# Patient Record
Sex: Female | Born: 1935 | Race: White | Hispanic: No | Marital: Married | Smoking: Never smoker
Health system: Southern US, Community
[De-identification: ages and names within clinical notes are randomized; demographics above are authoritative.]

## PROBLEM LIST (undated history)

## (undated) DIAGNOSIS — N390 Urinary tract infection, site not specified: Secondary | ICD-10-CM

## (undated) DIAGNOSIS — I10 Essential (primary) hypertension: Secondary | ICD-10-CM

## (undated) DIAGNOSIS — K219 Gastro-esophageal reflux disease without esophagitis: Secondary | ICD-10-CM

## (undated) DIAGNOSIS — N952 Postmenopausal atrophic vaginitis: Secondary | ICD-10-CM

## (undated) DIAGNOSIS — J189 Pneumonia, unspecified organism: Secondary | ICD-10-CM

## (undated) DIAGNOSIS — R1310 Dysphagia, unspecified: Secondary | ICD-10-CM

## (undated) DIAGNOSIS — D649 Anemia, unspecified: Secondary | ICD-10-CM

## (undated) DIAGNOSIS — M1990 Unspecified osteoarthritis, unspecified site: Secondary | ICD-10-CM

## (undated) DIAGNOSIS — G4730 Sleep apnea, unspecified: Secondary | ICD-10-CM

## (undated) DIAGNOSIS — R12 Heartburn: Secondary | ICD-10-CM

## (undated) DIAGNOSIS — A419 Sepsis, unspecified organism: Secondary | ICD-10-CM

## (undated) DIAGNOSIS — F0390 Unspecified dementia without behavioral disturbance: Secondary | ICD-10-CM

## (undated) DIAGNOSIS — Z87442 Personal history of urinary calculi: Secondary | ICD-10-CM

## (undated) DIAGNOSIS — I4891 Unspecified atrial fibrillation: Secondary | ICD-10-CM

## (undated) HISTORY — PX: ABDOMINAL HYSTERECTOMY: SUR658

## (undated) HISTORY — DX: Heartburn: R12

## (undated) HISTORY — DX: Sleep apnea, unspecified: G47.30

## (undated) HISTORY — DX: Unspecified osteoarthritis, unspecified site: M19.90

## (undated) HISTORY — PX: ROTATOR CUFF REPAIR: SHX139

## (undated) HISTORY — DX: Unspecified atrial fibrillation: I48.91

## (undated) HISTORY — PX: EYE SURGERY: SHX253

## (undated) HISTORY — DX: Essential (primary) hypertension: I10

## (undated) HISTORY — PX: OTHER SURGICAL HISTORY: SHX169

## (undated) HISTORY — DX: Postmenopausal atrophic vaginitis: N95.2

## (undated) HISTORY — DX: Urinary tract infection, site not specified: N39.0

## (undated) HISTORY — PX: CATARACT EXTRACTION: SUR2

---

## 2000-10-12 HISTORY — PX: ABDOMINAL HYSTERECTOMY: SHX81

## 2004-09-30 ENCOUNTER — Ambulatory Visit: Payer: Self-pay | Admitting: Gastroenterology

## 2005-02-27 ENCOUNTER — Emergency Department: Payer: Self-pay | Admitting: Emergency Medicine

## 2005-02-28 ENCOUNTER — Ambulatory Visit: Payer: Self-pay | Admitting: Emergency Medicine

## 2005-07-05 ENCOUNTER — Ambulatory Visit: Payer: Self-pay | Admitting: Unknown Physician Specialty

## 2006-07-05 ENCOUNTER — Ambulatory Visit: Payer: Self-pay | Admitting: Unknown Physician Specialty

## 2006-08-22 ENCOUNTER — Ambulatory Visit: Payer: Self-pay | Admitting: Unknown Physician Specialty

## 2006-09-07 ENCOUNTER — Ambulatory Visit: Payer: Self-pay | Admitting: Unknown Physician Specialty

## 2006-11-30 ENCOUNTER — Encounter: Payer: Self-pay | Admitting: Specialist

## 2007-06-05 ENCOUNTER — Ambulatory Visit: Payer: Self-pay | Admitting: Specialist

## 2007-07-12 ENCOUNTER — Ambulatory Visit: Payer: Self-pay | Admitting: Gastroenterology

## 2007-08-28 ENCOUNTER — Ambulatory Visit: Payer: Self-pay | Admitting: Unknown Physician Specialty

## 2007-11-07 ENCOUNTER — Emergency Department: Payer: Self-pay | Admitting: Internal Medicine

## 2008-08-29 ENCOUNTER — Ambulatory Visit: Payer: Self-pay | Admitting: Unknown Physician Specialty

## 2009-08-31 ENCOUNTER — Ambulatory Visit: Payer: Self-pay | Admitting: Unknown Physician Specialty

## 2010-09-02 ENCOUNTER — Ambulatory Visit: Payer: Self-pay | Admitting: Unknown Physician Specialty

## 2011-09-16 ENCOUNTER — Ambulatory Visit: Payer: Self-pay | Admitting: Unknown Physician Specialty

## 2012-09-28 ENCOUNTER — Ambulatory Visit: Payer: Self-pay | Admitting: Physician Assistant

## 2013-10-04 ENCOUNTER — Ambulatory Visit: Payer: Self-pay | Admitting: Physician Assistant

## 2014-07-03 ENCOUNTER — Emergency Department: Payer: Self-pay | Admitting: Emergency Medicine

## 2014-10-19 ENCOUNTER — Inpatient Hospital Stay: Payer: Self-pay | Admitting: Internal Medicine

## 2014-10-19 LAB — COMPREHENSIVE METABOLIC PANEL
ALT: 54 U/L
AST: 45 U/L — AB (ref 15–37)
Albumin: 3 g/dL — ABNORMAL LOW (ref 3.4–5.0)
Alkaline Phosphatase: 91 U/L
Anion Gap: 8 (ref 7–16)
BUN: 18 mg/dL (ref 7–18)
Bilirubin,Total: 0.9 mg/dL (ref 0.2–1.0)
CALCIUM: 8.8 mg/dL (ref 8.5–10.1)
CHLORIDE: 98 mmol/L (ref 98–107)
CO2: 28 mmol/L (ref 21–32)
CREATININE: 0.86 mg/dL (ref 0.60–1.30)
EGFR (African American): >60
GLUCOSE: 114 mg/dL — AB (ref 65–99)
OSMOLALITY: 271 (ref 275–301)
POTASSIUM: 3.9 mmol/L (ref 3.5–5.1)
Sodium: 134 mmol/L — ABNORMAL LOW (ref 136–145)
Total Protein: 6.9 g/dL (ref 6.4–8.2)

## 2014-10-19 LAB — URINALYSIS, COMPLETE
BILIRUBIN, UR: NEGATIVE
Glucose,UR: NEGATIVE mg/dL (ref 0–75)
NITRITE: NEGATIVE
PH: 6 (ref 4.5–8.0)
Protein: NEGATIVE
SPECIFIC GRAVITY: 1.017 (ref 1.003–1.030)
SQUAMOUS EPITHELIAL: NONE SEEN

## 2014-10-19 LAB — PROTIME-INR
INR: 1.1
PROTHROMBIN TIME: 14 s (ref 11.5–14.7)

## 2014-10-19 LAB — CBC
HCT: 37.5 % (ref 35.0–47.0)
HGB: 12.3 g/dL (ref 12.0–16.0)
MCH: 28.3 pg (ref 26.0–34.0)
MCHC: 32.8 g/dL (ref 32.0–36.0)
MCV: 86 fL (ref 80–100)
Platelet: 190 10*3/uL (ref 150–440)
RBC: 4.35 10*6/uL (ref 3.80–5.20)
RDW: 14.4 % (ref 11.5–14.5)
WBC: 11.4 10*3/uL — ABNORMAL HIGH (ref 3.6–11.0)

## 2014-10-19 LAB — TROPONIN I

## 2014-10-20 LAB — CBC WITH DIFFERENTIAL/PLATELET
BASOS PCT: 0.2 %
Basophil #: 0 10*3/uL (ref 0.0–0.1)
EOS ABS: 0 10*3/uL (ref 0.0–0.7)
Eosinophil %: 0 %
HCT: 35.1 % (ref 35.0–47.0)
HGB: 11.3 g/dL — ABNORMAL LOW (ref 12.0–16.0)
Lymphocyte #: 0.8 10*3/uL — ABNORMAL LOW (ref 1.0–3.6)
Lymphocyte %: 7 %
MCH: 28 pg (ref 26.0–34.0)
MCHC: 32.2 g/dL (ref 32.0–36.0)
MCV: 87 fL (ref 80–100)
MONOS PCT: 14.2 %
Monocyte #: 1.7 x10 3/mm — ABNORMAL HIGH (ref 0.2–0.9)
NEUTROS ABS: 9.4 10*3/uL — AB (ref 1.4–6.5)
NEUTROS PCT: 78.6 %
PLATELETS: 165 10*3/uL (ref 150–440)
RBC: 4.03 10*6/uL (ref 3.80–5.20)
RDW: 14 % (ref 11.5–14.5)
WBC: 12 10*3/uL — AB (ref 3.6–11.0)

## 2014-10-20 LAB — BASIC METABOLIC PANEL
ANION GAP: 9 (ref 7–16)
BUN: 18 mg/dL (ref 7–18)
CALCIUM: 8.3 mg/dL — AB (ref 8.5–10.1)
Chloride: 103 mmol/L (ref 98–107)
Co2: 27 mmol/L (ref 21–32)
Creatinine: 0.77 mg/dL (ref 0.60–1.30)
EGFR (African American): >60
EGFR (Non-African Amer.): >60
Glucose: 86 mg/dL (ref 65–99)
Osmolality: 279 (ref 275–301)
Potassium: 3.7 mmol/L (ref 3.5–5.1)
Sodium: 139 mmol/L (ref 136–145)

## 2014-10-22 LAB — CBC WITH DIFFERENTIAL/PLATELET
Basophil #: 0 10*3/uL (ref 0.0–0.1)
Basophil %: 0.7 %
EOS PCT: 2.1 %
Eosinophil #: 0.1 10*3/uL (ref 0.0–0.7)
HCT: 30.1 % — ABNORMAL LOW (ref 35.0–47.0)
HGB: 9.8 g/dL — ABNORMAL LOW (ref 12.0–16.0)
LYMPHS PCT: 21.6 %
Lymphocyte #: 1.1 10*3/uL (ref 1.0–3.6)
MCH: 28.5 pg (ref 26.0–34.0)
MCHC: 32.5 g/dL (ref 32.0–36.0)
MCV: 88 fL (ref 80–100)
Monocyte #: 0.5 x10 3/mm (ref 0.2–0.9)
Monocyte %: 9.2 %
NEUTROS ABS: 3.3 10*3/uL (ref 1.4–6.5)
NEUTROS PCT: 66.4 %
Platelet: 187 10*3/uL (ref 150–440)
RBC: 3.43 10*6/uL — ABNORMAL LOW (ref 3.80–5.20)
RDW: 14.2 % (ref 11.5–14.5)
WBC: 5 10*3/uL (ref 3.6–11.0)

## 2014-10-22 LAB — URINE CULTURE

## 2014-10-24 LAB — CULTURE, BLOOD (SINGLE)

## 2015-04-04 NOTE — H&P (Signed)
PATIENT NAME:  Denise Matthews, Denise Matthews MR#:  [id number] DATE OF BIRTH:  May 28, 1920  DATE OF ADMISSION:  10/19/2014  REFERRING PHYSICIAN:  Larae Grooms, MD  PRIMARY CARE PHYSICIAN:  Hewitt Blade. Sarina Ser, MD  CHIEF COMPLAINT:  Fever.   HISTORY OF PRESENT ILLNESS:  This is a 81 year old Caucasian female with history of hypertension, who is presenting with weakness and fever. The patient herself is unable to provide any meaningful information given mental status. History is obtained from daughter who is at the bedside. Apparently, she had a fever documented at home of 101 degrees Fahrenheit of one day's duration with associated altered mental status, described mainly as lethargy as well as confusion. Once again, the patient is unable to provide any further meaningful information given current mental status. They called her PCP, who advised to present to the hospital for further workup and evaluation. Here, she remained febrile at 101.7 degrees Fahrenheit. The remainder of her workup was revealing for a UTI.    REVIEW OF SYSTEMS:  Unable to obtain given the patient's mental status.   PAST MEDICAL HISTORY:  Hypertension, gastroesophageal reflux disease, obstructive sleep apnea, noncompliant with CPAP therapy.   SOCIAL HISTORY:  No alcohol, tobacco, or drug usage. Uses a walker for ambulation. Her daughter recently took her out of assisted living  FAMILY HISTORY:  No cardiovascular or pulmonary disorders.   ALLERGIES:  MACROBID, VITAMIN D2.   HOME MEDICATIONS:  Acetaminophen/tramadol 325/37.5 mg p.o. 3 times a day as needed for pain, metoprolol succinate 25 mg p.o. daily, donepezil 5 mg p.o. at bedtime, calcium plus vitamin D 600/400 international units 1 tab p.o. b.i.d., PreserVision multivitamin 1 tablet daily, vitamin B12, 1000 mcg p.o. daily.   PHYSICAL EXAMINATION: VITAL SIGNS:  Temperature 101.7, heart rate 94, respirations 20, blood pressure 137/59, . Weight 58.2 kg, BMI 22.8.  GENERAL:  Chronically  ill frail-appearing Caucasian female, currently in minimal distress given mental status.  HEAD:  Normocephalic, atraumatic.  EYES:  Pupils are equal, round, and reactive to light. Extraocular muscles are intact. No scleral icterus.  MOUTH:  Markedly dry mucosal membranes. Dentition is poor. No abscess noted.  EARS, NOSE, AND THROAT:  Clear without exudate. No external lesions. NECK:  Supple. No thyromegaly. No nodules. No JVD.  PULMONARY:  Clear to auscultation bilaterally without wheezes, rubs, or rhonchi. No use of accessory muscles. Poor respiratory effort.  CHEST:  Nontender to palpation.  CARDIOVASCULAR:  S1, S2, regular rate and rhythm. No murmurs, rubs, or gallops. No edema. Pedal pulses are 2+ bilaterally.  GASTROINTESTINAL:  Soft, nontender, nondistended. No masses. Positive bowel sounds. No hepatosplenomegaly.  MUSCULOSKELETAL:  No swelling, clubbing, or edema. Range of motion is full in all extremities.  NEUROLOGIC:  Cranial nerves II through XII are intact, however, somewhat difficult to get an accurate examination as she is unable to follow commands.  SKIN:  No ulceration, lesions, rashes, or cyanosis. Skin is warm and dry. Turgor is poor.  PSYCHIATRIC:  Mood and affect are blunted. She is awake, alert, and oriented only to person. Insight and judgment are poor at this time.   LABORATORY DATA AND IMAGING:  CT of the head performed reveals no acute intracranial process. Chest x-ray performed reveals no acute cardiopulmonary process. Remainder of laboratory data:  Sodium 134, potassium 3.9, chloride 98, bicarbonate 28, BUN 18, creatinine 0.86, glucose 114. LFTs:  Albumin 3, otherwise within normal limits. WBC 11.4, hemoglobin 12.3, platelets of 190,000. Urinalysis:  WBCs 46, RBCs 2, leukocyte esterase trace, epithelial  cells none.   ASSESSMENT AND PLAN:  A 81 year old Caucasian female with history of hypertension, presenting with weakness and fever, unable to provide any meaningful  information given mental status, which is somewhat worse than baseline.   1.  Sepsis secondary to urinary tract infection, meeting sepsis criteria by heart rate, temperature, respiratory rate, and leukocytosis present on arrival. Panculture including blood and urine, IV fluid hydration, mean arterial pressure greater than 65, antibiotic coverage with ceftriaxone, follow up culture data, adjust antibiotics accordingly.  2.  Hyponatremia. IV fluid hydration. Will follow sodium levels.  3.  Hypertension. Continue with beta blockade.  4.  Venous thromboembolism prophylaxis with heparin subcutaneously.  CODE STATUS:  The patient is a full code.   TIME SPENT:  45 minutes.    ____________________________ Aaron Mose. Hower, MD dkh:nb D: 10/19/2014 22:18:00 ET T: 10/19/2014 22:30:17 ET JOB#: [id number]  cc: Aaron Mose. Hower, MD, <Dictator> DAVID Woodfin Ganja MD ELECTRONICALLY SIGNED 10/20/2014 0:24

## 2015-04-04 NOTE — Discharge Summary (Signed)
PATIENT NAME:  Denise Matthews, Denise Matthews MR#:  [id number] DATE OF BIRTH:  09-20-1920  DATE OF ADMISSION:  10/19/2014 DATE OF DISCHARGE:  10/22/2014  CHIEF COMPLAINT: Fever.   ADMITTING DIAGNOSES:  1.  Sepsis secondary to urinary tract infection.  2.  Hyponatremia.  3.  Hypertension.   DISCHARGE DIAGNOSES:  1.  Sepsis from urinary tract infection.  2.  Hypernatremia, resolved.   PROCEDURES: None.   CONSULTATIONS: None.   BRIEF HISTORY AND HOSPITAL COURSE: The patient is a 81 year old Caucasian female brought into the ED after she spiked a temperature of 101 degrees Fahrenheit. She was brought in to the ED by her daughter.  The patient was altered. The patient was admitted to the hospital with sepsis from urinary tract infection. She was started on IV fluids and IV Rocephin was started after pancultures were obtained.   HOSPITAL COURSE:  1.  Blood cultures turned out to be negative x 2. Urine culture was not done from the ED.  As the laboratory did not receive any urine sample urine culture was ordered on November 10, which has revealed no growth. The patient is clinically doing fine. Decision is to discharge the patient with ciprofloxacin.  2.  Hyponatremia is significantly improved with IV fluids.  3.  The patient has chronic dementia of Alzheimer disease and the plan is to continue Aricept.  According to the patient's daughter she is at her baseline.  4.  Deconditioning, was provided with physical therapy. We have recommended to place the patient to SNF. After talking to her daughter who is agreeable the patient is transferred to [facility] facility for further continuation of the care. Condition was stable at the time of transfer.   LABORATORIES AND IMAGING STUDIES:  CAT scan of the head, no acute intracranial abnormalities. Portable chest x-ray, no acute abnormalities. On November 11 white count is normal, hemoglobin 9.8, hematocrit 30.1, platelets are normal. Urine culture  has revealed no growth 18-24 hours. Blood culture has revealed no growth in 48 hours x 2. BMP on November 9 was normal. Troponin less than 0.02.   MEDICATIONS AT THE TIME OF DISCHARGE: Metoprolol 50 mg 1/2 tablet p.o. once daily, vitamin B12 1000 mcg 1 tablet p.o. once daily, calcium carbonate 600 mg 1 tablet p.o. 2 times a day, Aricept 5 mg p.o. once daily, multivitamins with minerals 1 capsule p.o. 2 times a day, Tylenol with tramadol 325/37.5 one tablet p.o. 3 times a day, ciprofloxacin 250 mg 1 tablet p.o. q. 12 hours for 3 more days.    DIET:  Low sodium.    ACTIVITY: As recommended by physical therapy.   FOLLOWUP:  With primary care physician in 1-2 weeks.   Plan of care was discussed in detail with the patient's daughter, she verbalized understanding of the plan.   TOTAL TIME SPENT: 45 minutes.   Plan of care was discussed in detail with the patient, she verbalized understanding of the plan.    ____________________________ Nicholes Mango, MD ag:bu D: 10/22/2014 16:36:50 ET T: 10/22/2014 17:25:21 ET JOB#: [id number]  cc: Nicholes Mango, MD, <Dictator> Nicholes Mango MD ELECTRONICALLY SIGNED 10/29/2014 22:09

## 2015-12-24 DIAGNOSIS — R410 Disorientation, unspecified: Secondary | ICD-10-CM | POA: Diagnosis not present

## 2016-01-06 DIAGNOSIS — M6281 Muscle weakness (generalized): Secondary | ICD-10-CM | POA: Diagnosis not present

## 2016-01-06 DIAGNOSIS — W19XXXA Unspecified fall, initial encounter: Secondary | ICD-10-CM | POA: Diagnosis not present

## 2016-01-08 DIAGNOSIS — Z111 Encounter for screening for respiratory tuberculosis: Secondary | ICD-10-CM | POA: Diagnosis not present

## 2016-02-19 DIAGNOSIS — R410 Disorientation, unspecified: Secondary | ICD-10-CM | POA: Diagnosis not present

## 2016-02-19 DIAGNOSIS — R350 Frequency of micturition: Secondary | ICD-10-CM | POA: Diagnosis not present

## 2016-02-22 DIAGNOSIS — M810 Age-related osteoporosis without current pathological fracture: Secondary | ICD-10-CM | POA: Diagnosis not present

## 2016-02-22 DIAGNOSIS — E782 Mixed hyperlipidemia: Secondary | ICD-10-CM | POA: Diagnosis not present

## 2016-02-22 DIAGNOSIS — I480 Paroxysmal atrial fibrillation: Secondary | ICD-10-CM | POA: Diagnosis not present

## 2016-02-22 DIAGNOSIS — I10 Essential (primary) hypertension: Secondary | ICD-10-CM | POA: Diagnosis not present

## 2016-02-22 DIAGNOSIS — F0391 Unspecified dementia with behavioral disturbance: Secondary | ICD-10-CM | POA: Diagnosis not present

## 2016-03-07 DIAGNOSIS — G8929 Other chronic pain: Secondary | ICD-10-CM | POA: Diagnosis not present

## 2016-03-07 DIAGNOSIS — N390 Urinary tract infection, site not specified: Secondary | ICD-10-CM | POA: Diagnosis not present

## 2016-03-07 DIAGNOSIS — F0391 Unspecified dementia with behavioral disturbance: Secondary | ICD-10-CM | POA: Diagnosis not present

## 2016-03-07 DIAGNOSIS — R4182 Altered mental status, unspecified: Secondary | ICD-10-CM | POA: Diagnosis not present

## 2016-03-07 DIAGNOSIS — M25561 Pain in right knee: Secondary | ICD-10-CM | POA: Diagnosis not present

## 2016-03-07 DIAGNOSIS — R5383 Other fatigue: Secondary | ICD-10-CM | POA: Diagnosis not present

## 2016-03-10 DIAGNOSIS — M1711 Unilateral primary osteoarthritis, right knee: Secondary | ICD-10-CM | POA: Diagnosis not present

## 2016-03-15 DIAGNOSIS — R8271 Bacteriuria: Secondary | ICD-10-CM | POA: Diagnosis not present

## 2016-03-29 ENCOUNTER — Encounter: Payer: Self-pay | Admitting: Urology

## 2016-03-29 ENCOUNTER — Ambulatory Visit (INDEPENDENT_AMBULATORY_CARE_PROVIDER_SITE_OTHER): Payer: PPO | Admitting: Urology

## 2016-03-29 VITALS — BP 157/90 | HR 98 | Ht 62.0 in | Wt 118.7 lb

## 2016-03-29 DIAGNOSIS — N952 Postmenopausal atrophic vaginitis: Secondary | ICD-10-CM

## 2016-03-29 DIAGNOSIS — N390 Urinary tract infection, site not specified: Secondary | ICD-10-CM

## 2016-03-29 LAB — URINALYSIS, COMPLETE
BILIRUBIN UA: NEGATIVE
GLUCOSE, UA: NEGATIVE
KETONES UA: NEGATIVE
NITRITE UA: NEGATIVE
SPEC GRAV UA: 1.02 (ref 1.005–1.030)
UUROB: 0.2 mg/dL (ref 0.2–1.0)
pH, UA: 5.5 (ref 5.0–7.5)

## 2016-03-29 LAB — MICROSCOPIC EXAMINATION
Bacteria, UA: NONE SEEN
EPITHELIAL CELLS (NON RENAL): NONE SEEN /HPF (ref 0–10)
RBC MICROSCOPIC, UA: NONE SEEN /HPF (ref 0–?)

## 2016-03-29 LAB — BLADDER SCAN AMB NON-IMAGING: SCAN RESULT: 0

## 2016-03-29 NOTE — Progress Notes (Signed)
03/29/2016 3:15 PM   Denise Matthews 07/22/1920 [id number]  Referring provider: No referring provider defined for this encounter.  Chief Complaint  Patient presents with  . Recurrent UTI    referred by Denise Reaper PA    HPI: Patient is a 82 year old Caucasian female who is referred by her PCP, Denise Reaper PA, or recurrent urinary tract infections.  Patient is a poor historian and presents today with her daughter, Denise Matthews.  Patient's daughter states that she's had 3 urinary tract infections since Christmas.   Her urine cultures have been positive for enterococcus and Escherichia coli.  Her symptoms with a urinary tract infection consist of mental confusion.  Patient's daughter states since 2001, after a complete hysterectomy and vaginal sling, she is had incontinence. Her daughter describes it as an urge incontinence. She states that the patient wears 1 depends during the day and changes it at night before she goes to bed.  The incontinence volume is mild to moderate.  Her baseline urinary symptoms consist of urgency, nocturia 3, urge incontinence, intermittency and hesitancy.  She does not complain of dysuria, gross hematuria or suprapubic pain.  She does not have a prior history of kidney stone disease.  She is not experiencing fever, chills, nausea or vomiting.  Her daughter states that she has not noted any recent mental confusion.  Her UA is unremarkable and her PVR is 0 mL.  She had a CT of the abdomen and pelvis in 2011 and it noted a peripelvic cyst involving the left kidney and a right renal cyst. No urinary calculi were identified.    PMH: Past Medical History  Diagnosis Date  . Heartburn   . Arthritis   . Recurrent UTI   . Atrial fibrillation ([place])   . HTN (hypertension)   . Sleep apnea     Surgical History: Past Surgical History  Procedure Laterality Date  . Abdominal hysterectomy    . Cataract extraction    . Rotator cuff repair    . Vaginal sling       Home Medications:    Medication List       This list is accurate as of: 03/29/16  3:15 PM.  Always use your most recent med list.               acetaminophen 500 MG tablet  Commonly known as:  TYLENOL  Take by mouth.     amoxicillin-clavulanate 500-125 MG tablet  Commonly known as:  AUGMENTIN  Reported on 03/29/2016     CALCIUM 500/D 500-200 MG-UNIT tablet  Generic drug:  calcium-vitamin D  Take by mouth.     cefUROXime 250 MG tablet  Commonly known as:  CEFTIN  Reported on 03/29/2016     ciprofloxacin 250 MG tablet  Commonly known as:  CIPRO  Reported on 03/29/2016     docusate sodium 100 MG capsule  Commonly known as:  COLACE  Take by mouth.     donepezil 5 MG tablet  Commonly known as:  ARICEPT  Take by mouth.     metoprolol succinate 50 MG 24 hr tablet  Commonly known as:  TOPROL-XL  Take 25 mg by mouth.     PRESERVISION AREDS PO  Take by mouth 2 (two) times daily.     QUEtiapine 25 MG tablet  Commonly known as:  SEROQUEL  Take by mouth.     RA VITAMIN B-12 TR 1000 MCG Tbcr  Generic drug:  Cyanocobalamin  Take by mouth.  traMADol-acetaminophen 37.5-325 MG tablet  Commonly known as:  ULTRACET  Take by mouth.        Allergies:  Allergies  Allergen Reactions  . Cholecalciferol Other (See Comments)  . Nitrofurantoin Macrocrystal Itching and Rash    Family History: Family History  Problem Relation Age of Onset  . Kidney disease Neg Hx   . Bladder Cancer Neg Hx     Social History:  reports that she has never smoked. She does not have any smokeless tobacco history on file. She reports that she does not drink alcohol or use illicit drugs.  ROS: UROLOGY Frequent Urination?: No Hard to postpone urination?: Yes Burning/pain with urination?: No Get up at night to urinate?: Yes Leakage of urine?: Yes Urine stream starts and stops?: Yes Trouble starting stream?: Yes Do you have to strain to urinate?: No Blood in urine?: No Urinary  tract infection?: Yes Sexually transmitted disease?: No Injury to kidneys or bladder?: No Painful intercourse?: No Weak stream?: No Currently pregnant?: No Vaginal bleeding?: No Last menstrual period?: n  Gastrointestinal Nausea?: No Vomiting?: No Indigestion/heartburn?: No Diarrhea?: No Constipation?: Yes  Constitutional Fever: No Night sweats?: No Weight loss?: Yes Fatigue?: No  Skin Skin rash/lesions?: No Itching?: No  Eyes Blurred vision?: No Double vision?: No  Ears/Nose/Throat Sore throat?: No Sinus problems?: No  Hematologic/Lymphatic Swollen glands?: No Easy bruising?: Yes  Cardiovascular Leg swelling?: Yes Chest pain?: No  Respiratory Cough?: No Shortness of breath?: No  Endocrine Excessive thirst?: No  Musculoskeletal Back pain?: No Joint pain?: No  Neurological Headaches?: No Dizziness?: No  Psychologic Depression?: No Anxiety?: No  Physical Exam: BP 157/90 mmHg  Pulse 98  Ht 5\' 2"  (1.575 m)  Wt 118 lb 11.2 oz (53.842 kg)  BMI 21.71 kg/m2  Constitutional: Well nourished. Alert and oriented, No acute distress. HEENT: [place] AT, moist mucus membranes. Trachea midline, no masses. Cardiovascular: No clubbing, cyanosis, or edema. Respiratory: Normal respiratory effort, no increased work of breathing. GI: Abdomen is soft, non tender, non distended, no abdominal masses. Liver and spleen not palpable.  No hernias appreciated.  Stool sample for occult testing is not indicated.   GU: No CVA tenderness.  No bladder fullness or masses.  Atrophic external genitalia, normal pubic hair distribution, external labia with multiple sebaceous cysts.   Urethral caruncle is noted without prolapse or discharge.   No urethral masses, tenderness and/or tenderness. No bladder fullness, tenderness or masses. Atrophic vagina mucosa, poor estrogen effect, no discharge, no lesions, good pelvic support, no cystocele or rectocele noted.  Cervix, uterus and ovaries are  surgically absent Anus and perineum are without rashes or lesions.    Skin: No rashes, bruises or suspicious lesions. Lymph: No cervical or inguinal adenopathy. Neurologic: Grossly intact, no focal deficits, moving all 4 extremities. Psychiatric: Normal mood and affect.  Laboratory Data: Lab Results  Component Value Date   WBC 5.0 10/22/2014   HGB 9.8* 10/22/2014   HCT 30.1* 10/22/2014   MCV 88 10/22/2014   PLT 187 10/22/2014    Lab Results  Component Value Date   CREATININE 0.77 10/20/2014   Lab Results  Component Value Date   AST 45* 10/19/2014   Lab Results  Component Value Date   ALT 54 10/19/2014    Urinalysis Results for orders placed or performed in visit on 03/29/16  Microscopic Examination  Result Value Ref Range   WBC, UA >30 (A) 0 -  5 /hpf   RBC, UA None seen 0 -  2 /  hpf   Epithelial Cells (non renal) None seen 0 - 10 /hpf   Bacteria, UA None seen None seen/Few  Urinalysis, Complete  Result Value Ref Range   Specific Gravity, UA 1.020 1.005 - 1.030   pH, UA 5.5 5.0 - 7.5   Color, UA Yellow Yellow   Appearance Ur Turbid (A) Clear   Leukocytes, UA 3+ (A) Negative   Protein, UA 3+ (A) Negative/Trace   Glucose, UA Negative Negative   Ketones, UA Negative Negative   RBC, UA 3+ (A) Negative   Bilirubin, UA Negative Negative   Urobilinogen, Ur 0.2 0.2 - 1.0 mg/dL   Nitrite, UA Negative Negative   Microscopic Examination See below:   BLADDER SCAN AMB NON-IMAGING  Result Value Ref Range   Scan Result 0     Pertinent Imaging:  PRIOR REPORT IMPORTED FROM THE [employer] WORKFLOW SYSTEM   REASON FOR EXAM: abd pain with weight loss hx pelvic surgery using mesh  COMMENTS:   PROCEDURE: KCT - KCT ABDOMEN/PELVIS WO - Sep 02 2010 2:19PM   RESULT: Axial CT scanning was performed through the abdomen and pelvis  at 5 mm intervals and slice thicknesses following administration of oral  contrast. Review of multiplanar reconstructed images was  performed  separately on the VIA monitor.   There is a large hiatal hernia/partially intrathoracic stomach. The liver  exhibits no focal mass or ductal dilation. This gallbladder is adequately  distended with no evidence of calcified stones. The pancreas is grossly  normal for this noncontrast exam. The spleen is not enlarged. I see no  adrenal masses. There are parapelvic cyst involving the left kidney. There  is a cortically based smoothly marginated 2.5 x 3 cm diameter hypodensity  associated with the upper pole of the right kidney compatible with a cyst.  It exhibits Hounsfield measurement of +1. The caliber of the abdominal  aorta  is normal. I see no periaortic nor pericaval if adenopathy.   The partially contrast-filled loops of small bowel are normal in  appearance.  Contrast has not yet reached the colon. The stool and gas pattern is  grossly  normal. The urinary bladder is normal in appearance. The uterus is  surgically absent. I see no adnexal masses nor free pelvic fluid. The lung  bases exhibit mild emphysematous change. There is degenerative change and  scoliosis of the lumbar spine with the convexity toward the right.   IMPRESSION:  1. The study is limited without IV contrast material. The orally  administered contrast has not yet reached the colon. I do not see evidence  of acute bowel abnormality.  2. I do not see evidence of acute hepatobiliary abnormality nor acute  abnormality of the kidneys.  3. I do not see intra-abdominal nor pelvic lymphadenopathy.  4. There is a large hiatal hernia-partially intrathoracic stomach. There  is  considerable thickening of portions of the wall of this hernia-stomach. It  may be useful to consider the patient for direct visualization to exclude  mucosal abnormalities here.     Assessment & Plan:    1. Recurrent UTI:   Patient not exhibiting signs of infection at this time. We will  continue to monitor. She is initiated on vaginal estrogen cream in order to improve the vaginal mucosa. She will be returning in 2 weeks and we'll recheck a UA at that time.  - Urinalysis, Complete - BLADDER SCAN AMB NON-IMAGING  2.Vaginal atrophy:   Patient was given a sample of vaginal estrogen cream  and instructed to apply 0.5mg  (pea-sized amount)  just inside the vaginal introitus with a finger-tip every night for two weeks and then Monday, Wednesday and Friday nights.  I explained to the patient that vaginally administered estrogen, which causes only a slight increase in the blood estrogen levels, have fewer contraindications and adverse systemic effects that oral HT.   Return in about 2 weeks (around 04/12/2016) for exam.  These notes generated with voice recognition software. I apologize for typographical errors.  Zara Council, [facility] [address], [place], [place] [address] [phone]

## 2016-04-03 DIAGNOSIS — N952 Postmenopausal atrophic vaginitis: Secondary | ICD-10-CM | POA: Insufficient documentation

## 2016-04-03 DIAGNOSIS — N390 Urinary tract infection, site not specified: Secondary | ICD-10-CM | POA: Insufficient documentation

## 2016-04-12 ENCOUNTER — Ambulatory Visit (INDEPENDENT_AMBULATORY_CARE_PROVIDER_SITE_OTHER): Payer: PPO | Admitting: Urology

## 2016-04-12 ENCOUNTER — Encounter: Payer: Self-pay | Admitting: Urology

## 2016-04-12 VITALS — BP 145/73 | HR 90 | Ht 65.0 in | Wt 118.3 lb

## 2016-04-12 DIAGNOSIS — N390 Urinary tract infection, site not specified: Secondary | ICD-10-CM | POA: Diagnosis not present

## 2016-04-12 DIAGNOSIS — N952 Postmenopausal atrophic vaginitis: Secondary | ICD-10-CM | POA: Diagnosis not present

## 2016-04-12 LAB — MICROSCOPIC EXAMINATION
BACTERIA UA: NONE SEEN
EPITHELIAL CELLS (NON RENAL): NONE SEEN /HPF (ref 0–10)

## 2016-04-12 LAB — URINALYSIS, COMPLETE
BILIRUBIN UA: NEGATIVE
Glucose, UA: NEGATIVE
Ketones, UA: NEGATIVE
Nitrite, UA: POSITIVE — AB
PH UA: 6 (ref 5.0–7.5)
Specific Gravity, UA: 1.025 (ref 1.005–1.030)
UUROB: 0.2 mg/dL (ref 0.2–1.0)

## 2016-04-12 MED ORDER — ESTROGENS, CONJUGATED 0.625 MG/GM VA CREA: 1.0000 | TOPICAL_CREAM | Freq: Every day | VAGINAL | Status: DC

## 2016-04-12 MED ORDER — ESTRADIOL 0.1 MG/GM VA CREA: TOPICAL_CREAM | VAGINAL | Status: DC

## 2016-04-12 NOTE — Progress Notes (Signed)
1:57 PM   Denise Matthews 10-25-1920 [id number]  Referring provider: Rusty Aus, MD [place] [facility] [place], [place] [address]  Chief Complaint  Patient presents with  . Recurrent UTI    2 weeks follow up  . vaginal atrophy    HPI: Patient is a 82 year old Caucasian female who presents today for 2 week follow-up after he placed on vaginal estrogen cream for vaginal atrophy and recurrent urinary tract infections.  Background history Patient was referred by her PCP, Boykin Reaper PA, or recurrent urinary tract infections.  Patient is a poor historian and presents today with her daughter, Denise Matthews.  Patient's daughter states that she's had 3 urinary tract infections since Christmas.   Her urine cultures have been positive for enterococcus and Escherichia coli.  Her symptoms with a urinary tract infection consist of mental confusion.  Patient's daughter states since 2001, after a complete hysterectomy and vaginal sling, she is had incontinence. Her daughter describes it as an urge incontinence. She states that the patient wears 1 depends during the day and changes it at night before she goes to bed.  The incontinence volume is mild to moderate.  Her baseline urinary symptoms consist of urgency, nocturia 3, urge incontinence, intermittency and hesitancy.  She does not complain of dysuria, gross hematuria or suprapubic pain.  She does not have a prior history of kidney stone disease.  She is not experiencing fever, chills, nausea or vomiting.    Her daughter states that she has note noted any recent mental confusion.  Hospice is involved.  Her UA is nitrite positive today.    She had a CT of the abdomen and pelvis in 2011 and it noted a peripelvic cyst involving the left kidney and a right renal cyst. No urinary calculi were identified.  They'll use the vaginal cream for one week as they forgot the samples at home when they went to the beach.  The  patient did not experience any vaginal burning or rash with the application of the cream.    PMH: Past Medical History  Diagnosis Date  . Heartburn   . Arthritis   . Recurrent UTI   . Atrial fibrillation ([place])   . HTN (hypertension)   . Sleep apnea     Surgical History: Past Surgical History  Procedure Laterality Date  . Abdominal hysterectomy    . Cataract extraction    . Rotator cuff repair    . Vaginal sling      Home Medications:    Medication List       This list is accurate as of: 04/12/16  1:57 PM.  Always use your most recent med list.               acetaminophen 500 MG tablet  Commonly known as:  TYLENOL  Take by mouth.     amoxicillin-clavulanate 500-125 MG tablet  Commonly known as:  AUGMENTIN  Reported on 04/12/2016     CALCIUM 500/D 500-200 MG-UNIT tablet  Generic drug:  calcium-vitamin D  Take by mouth.     ciprofloxacin 250 MG tablet  Commonly known as:  CIPRO  Reported on 04/12/2016     conjugated estrogens vaginal cream  Commonly known as:  PREMARIN  Place 1 Applicatorful vaginally daily. Apply 0.5mg  (pea-sized amount)  just inside the vaginal introitus with a finger-tip every night for two weeks and then Monday, Wednesday and Friday nights.     docusate sodium 100  MG capsule  Commonly known as:  COLACE  Take by mouth.     donepezil 5 MG tablet  Commonly known as:  ARICEPT  Take by mouth.     ESTRACE VAGINAL 0.1 MG/GM vaginal cream  Generic drug:  estradiol  Place 1 Applicatorful vaginally at bedtime.     estradiol 0.1 MG/GM vaginal cream  Commonly known as:  ESTRACE VAGINAL  Apply 0.5mg  (pea-sized amount)  just inside the vaginal introitus with a finger-tip every night for two weeks and then Monday, Wednesday and Friday nights.     metoprolol succinate 50 MG 24 hr tablet  Commonly known as:  TOPROL-XL  Take 25 mg by mouth.     PRESERVISION AREDS PO  Take by mouth 2 (two) times daily.     QUEtiapine 25 MG tablet  Commonly known  as:  SEROQUEL  Take by mouth.     RA VITAMIN B-12 TR 1000 MCG Tbcr  Generic drug:  Cyanocobalamin  Take by mouth.     traMADol-acetaminophen 37.5-325 MG tablet  Commonly known as:  ULTRACET  Take by mouth.        Allergies:  Allergies  Allergen Reactions  . Cholecalciferol Other (See Comments)  . Nitrofurantoin Macrocrystal Itching and Rash    Family History: Family History  Problem Relation Age of Onset  . Kidney disease Neg Hx   . Bladder Cancer Neg Hx     Social History:  reports that she has never smoked. She does not have any smokeless tobacco history on file. She reports that she does not drink alcohol or use illicit drugs.  ROS: UROLOGY Frequent Urination?: No Hard to postpone urination?: No Burning/pain with urination?: No Get up at night to urinate?: Yes Leakage of urine?: Yes Urine stream starts and stops?: No Trouble starting stream?: No Do you have to strain to urinate?: No Blood in urine?: No Urinary tract infection?: Yes Sexually transmitted disease?: No Injury to kidneys or bladder?: No Painful intercourse?: No Weak stream?: No Currently pregnant?: No Vaginal bleeding?: No Last menstrual period?: n  Gastrointestinal Nausea?: No Vomiting?: No Indigestion/heartburn?: No Diarrhea?: Yes Constipation?: Yes  Constitutional Fever: No Night sweats?: No Weight loss?: No Fatigue?: No  Skin Skin rash/lesions?: No Itching?: No  Eyes Blurred vision?: No Double vision?: No  Ears/Nose/Throat Sore throat?: No Sinus problems?: No  Hematologic/Lymphatic Swollen glands?: No Easy bruising?: Yes  Cardiovascular Leg swelling?: No Chest pain?: No  Respiratory Cough?: No Shortness of breath?: No  Endocrine Excessive thirst?: No  Musculoskeletal Back pain?: Yes Joint pain?: No  Neurological Headaches?: No Dizziness?: No  Psychologic Depression?: No Anxiety?: No  Physical Exam: BP 145/73 mmHg  Pulse 90  Ht 5\' 5"  (1.651 m)   Wt 118 lb 4.8 oz (53.661 kg)  BMI 19.69 kg/m2  Constitutional: Well nourished. Alert and oriented, No acute distress. HEENT: [place] AT, moist mucus membranes. Trachea midline, no masses. Cardiovascular: No clubbing, cyanosis, or edema. Respiratory: Normal respiratory effort, no increased work of breathing. GI: Abdomen is soft, non tender, non distended, no abdominal masses. Liver and spleen not palpable.  No hernias appreciated.  Stool sample for occult testing is not indicated.   GU: No CVA tenderness.  No bladder fullness or masses.  Atrophic external genitalia, normal pubic hair distribution, external labia with multiple sebaceous cysts.   Urethral caruncle is noted without prolapse or discharge.   No urethral masses, tenderness and/or tenderness. No bladder fullness, tenderness or masses. Atrophic vagina mucosa, poor estrogen effect, no discharge,  no lesions, good pelvic support, no cystocele or rectocele noted.  Cervix, uterus and ovaries are surgically absent Anus and perineum are without rashes or lesions.    Skin: No rashes, bruises or suspicious lesions. Lymph: No cervical or inguinal adenopathy. Neurologic: Grossly intact, no focal deficits, moving all 4 extremities. Psychiatric: Normal mood and affect.  Laboratory Data: Lab Results  Component Value Date   WBC 5.0 10/22/2014   HGB 9.8* 10/22/2014   HCT 30.1* 10/22/2014   MCV 88 10/22/2014   PLT 187 10/22/2014    Lab Results  Component Value Date   CREATININE 0.77 10/20/2014   Lab Results  Component Value Date   AST 45* 10/19/2014   Lab Results  Component Value Date   ALT 54 10/19/2014    Urinalysis Results for orders placed or performed in visit on 04/12/16  CULTURE, URINE COMPREHENSIVE  Result Value Ref Range   Urine Culture, Comprehensive Final report (A)    Result 1 Escherichia coli (A)    ANTIMICROBIAL SUSCEPTIBILITY Comment   Microscopic Examination  Result Value Ref Range   WBC, UA >30 (A) 0 -  5 /hpf    RBC, UA >30 (A) 0 -  2 /hpf   Epithelial Cells (non renal) None seen 0 - 10 /hpf   Bacteria, UA None seen None seen/Few  Urinalysis, Complete  Result Value Ref Range   Specific Gravity, UA 1.025 1.005 - 1.030   pH, UA 6.0 5.0 - 7.5   Color, UA Yellow Yellow   Appearance Ur Cloudy (A) Clear   Leukocytes, UA 3+ (A) Negative   Protein, UA 3+ (A) Negative/Trace   Glucose, UA Negative Negative   Ketones, UA Negative Negative   RBC, UA 3+ (A) Negative   Bilirubin, UA Negative Negative   Urobilinogen, Ur 0.2 0.2 - 1.0 mg/dL   Nitrite, UA Positive (A) Negative   Microscopic Examination See below:    Pertinent Imaging:  PRIOR REPORT IMPORTED FROM THE [employer] WORKFLOW SYSTEM   REASON FOR EXAM: abd pain with weight loss hx pelvic surgery using mesh  COMMENTS:   PROCEDURE: KCT - KCT ABDOMEN/PELVIS WO - Sep 02 2010 2:19PM   RESULT: Axial CT scanning was performed through the abdomen and pelvis  at 5 mm intervals and slice thicknesses following administration of oral  contrast. Review of multiplanar reconstructed images was performed  separately on the VIA monitor.   There is a large hiatal hernia/partially intrathoracic stomach. The liver  exhibits no focal mass or ductal dilation. This gallbladder is adequately  distended with no evidence of calcified stones. The pancreas is grossly  normal for this noncontrast exam. The spleen is not enlarged. I see no  adrenal masses. There are parapelvic cyst involving the left kidney. There  is a cortically based smoothly marginated 2.5 x 3 cm diameter hypodensity  associated with the upper pole of the right kidney compatible with a cyst.  It exhibits Hounsfield measurement of +1. The caliber of the abdominal  aorta  is normal. I see no periaortic nor pericaval if adenopathy.   The partially contrast-filled loops of small bowel are normal in  appearance.  Contrast has not yet reached the colon. The stool and  gas pattern is  grossly  normal. The urinary bladder is normal in appearance. The uterus is  surgically absent. I see no adnexal masses nor free pelvic fluid. The lung  bases exhibit mild emphysematous change. There is degenerative change and  scoliosis of the lumbar spine with  the convexity toward the right.   IMPRESSION:  1. The study is limited without IV contrast material. The orally  administered contrast has not yet reached the colon. I do not see evidence  of acute bowel abnormality.  2. I do not see evidence of acute hepatobiliary abnormality nor acute  abnormality of the kidneys.  3. I do not see intra-abdominal nor pelvic lymphadenopathy.  4. There is a large hiatal hernia-partially intrathoracic stomach. There  is  considerable thickening of portions of the wall of this hernia-stomach. It  may be useful to consider the patient for direct visualization to exclude  mucosal abnormalities here.     Assessment & Plan:    1. Recurrent UTI:   Patient's UA is nitrate positive. I will send it for culture. I will not initiate an antibiotic at this time. I will wait upon urine culture and sensitivity results.  - Urinalysis, Complete  2.Vaginal atrophy:   Patient prescriptions for the two brand name estrogen creams.  If they find both prescriptions cost prohibitive, I have asked that he contact the pharmacy so that we may call in a compounded estrogen for Mrs. [place]fred[place]. They'll return in 3 months for an exam.    Return in about 3 months (around 07/13/2016) for EXAM.  These notes generated with voice recognition software. I apologize for typographical errors.  Zara Council, [facility] [address] [place], [place] [address] [phone]

## 2016-04-14 LAB — CULTURE, URINE COMPREHENSIVE

## 2016-04-15 ENCOUNTER — Telehealth: Payer: Self-pay

## 2016-04-15 DIAGNOSIS — N390 Urinary tract infection, site not specified: Secondary | ICD-10-CM

## 2016-04-15 MED ORDER — AMOXICILLIN-POT CLAVULANATE 875-125 MG PO TABS: 1.0000 | ORAL_TABLET | Freq: Two times a day (BID) | ORAL | Status: AC

## 2016-04-15 NOTE — Telephone Encounter (Signed)
Spoke to patient's daughter. Antibiotics have already been picked up.  I scheduled a nurse visit for a CATH visit for 3 to 5 days after abx complete.

## 2016-04-15 NOTE — Telephone Encounter (Signed)
Hospice called and said they cannot accept prescriptions from a PA, so they will have to put under Dr. Erlene Quan.

## 2016-04-15 NOTE — Telephone Encounter (Signed)
-----   Message from Nori Riis, PA-C sent at 04/14/2016  5:00 PM EDT ----- Patient has a +UCx.  They need to start Augmentin 875/125, one tablet twice daily for seven days and then we need to check a CATH specimen in 3 to 5 days after they complete their antibiotics.

## 2016-04-15 NOTE — Telephone Encounter (Signed)
LMOM-medication sent to pharmacy 

## 2016-04-18 NOTE — Telephone Encounter (Signed)
Spoke with pt daughter in reference to pt +ucx. Daughter stated medication was started Friday evening.

## 2016-04-18 NOTE — Telephone Encounter (Signed)
-----   Message from Nori Riis, PA-C sent at 04/14/2016  5:00 PM EDT ----- Patient has a +UCx.  They need to start Augmentin 875/125, one tablet twice daily for seven days and then we need to check a CATH specimen in 3 to 5 days after they complete their antibiotics.

## 2016-04-25 ENCOUNTER — Ambulatory Visit (INDEPENDENT_AMBULATORY_CARE_PROVIDER_SITE_OTHER): Payer: PPO

## 2016-04-25 DIAGNOSIS — N390 Urinary tract infection, site not specified: Secondary | ICD-10-CM | POA: Diagnosis not present

## 2016-04-25 LAB — MICROSCOPIC EXAMINATION
Epithelial Cells (non renal): NONE SEEN /hpf (ref 0–10)
RBC, UA: NONE SEEN /hpf (ref 0–?)
WBC, UA: >30 /hpf — ABNORMAL HIGH (ref 0–?)

## 2016-04-25 LAB — URINALYSIS, COMPLETE
Bilirubin, UA: NEGATIVE
Glucose, UA: NEGATIVE
Nitrite, UA: POSITIVE — AB
PH UA: 5.5 (ref 5.0–7.5)
Specific Gravity, UA: 1.025 (ref 1.005–1.030)
Urobilinogen, Ur: 0.2 mg/dL (ref 0.2–1.0)

## 2016-04-25 NOTE — Progress Notes (Signed)
In and Out Catheterization  Patient is present today for a I & O catheterization due to recurrent UTI. Patient was cleaned and prepped in a sterile fashion with betadine and Lidocaine 2% jelly was instilled into the urethra.  A 14FR cath was inserted no complications were noted , 132ml of urine return was noted, urine was cloudy and yellow in color. A clean urine sample was collected for u/a and cx. Bladder was drained  And catheter was removed with out difficulty.    Preformed by: Toniann Fail ,LPN

## 2016-04-28 ENCOUNTER — Telehealth: Payer: Self-pay

## 2016-04-28 DIAGNOSIS — N390 Urinary tract infection, site not specified: Secondary | ICD-10-CM

## 2016-04-28 LAB — CULTURE, URINE COMPREHENSIVE

## 2016-04-28 MED ORDER — TRIMETHOPRIM 100 MG PO TABS: 100.0000 mg | ORAL_TABLET | Freq: Every day | ORAL | Status: DC

## 2016-04-28 MED ORDER — AMOXICILLIN-POT CLAVULANATE 875-125 MG PO TABS: 1.0000 | ORAL_TABLET | Freq: Two times a day (BID) | ORAL | Status: DC

## 2016-04-28 MED ORDER — AMOXICILLIN-POT CLAVULANATE 875-125 MG PO TABS: 1.0000 | ORAL_TABLET | Freq: Two times a day (BID) | ORAL | Status: AC

## 2016-04-28 NOTE — Telephone Encounter (Signed)
-----   Message from Nori Riis, PA-C sent at 04/28/2016 12:08 PM EDT ----- Patient has an UTI.  She needs to start Augmentin 875/125 mg, one tablet twice daily #20.   Then take trimethoprim 100 mg daily until her next appointment in August.

## 2016-04-28 NOTE — Telephone Encounter (Signed)
Spoke with pt daughter in reference to abx needed. Daughter stated they were on the way to [facility] and the augmentin would have to be called into [facility] at [facility]. Reinforced with daughter to start trimethoprim daily thereafter until August appt. That medication sent to [facility] in [place]. Daughter voiced understanding.

## 2016-05-20 DIAGNOSIS — F0391 Unspecified dementia with behavioral disturbance: Secondary | ICD-10-CM | POA: Diagnosis not present

## 2016-05-20 DIAGNOSIS — E782 Mixed hyperlipidemia: Secondary | ICD-10-CM | POA: Diagnosis not present

## 2016-05-20 DIAGNOSIS — M7989 Other specified soft tissue disorders: Secondary | ICD-10-CM | POA: Diagnosis not present

## 2016-05-20 DIAGNOSIS — M25532 Pain in left wrist: Secondary | ICD-10-CM | POA: Diagnosis not present

## 2016-05-20 DIAGNOSIS — M158 Other polyosteoarthritis: Secondary | ICD-10-CM | POA: Diagnosis not present

## 2016-05-20 DIAGNOSIS — I10 Essential (primary) hypertension: Secondary | ICD-10-CM | POA: Diagnosis not present

## 2016-05-20 DIAGNOSIS — M810 Age-related osteoporosis without current pathological fracture: Secondary | ICD-10-CM | POA: Diagnosis not present

## 2016-06-20 ENCOUNTER — Telehealth: Payer: Self-pay | Admitting: Urology

## 2016-06-20 DIAGNOSIS — N952 Postmenopausal atrophic vaginitis: Secondary | ICD-10-CM

## 2016-06-20 MED ORDER — ESTRADIOL 0.1 MG/GM VA CREA: TOPICAL_CREAM | VAGINAL | Status: DC

## 2016-06-20 MED ORDER — ESTROGENS, CONJUGATED 0.625 MG/GM VA CREA
1.0000 | TOPICAL_CREAM | Freq: Every day | VAGINAL | Status: DC
Start: 2016-06-20 — End: 2016-12-22

## 2016-06-20 NOTE — Telephone Encounter (Signed)
Patient's daughter called and said Larene Beach wrote her two scripts one for Estrace and one for Premarin cream so that she can get proces for the two from [facility] and her mail order pharmacy. She has since lost them and would like someone to write another one for her to pick up for her mom. Please call ger back, her name is Denise Matthews and her number is [phone]   Sharyn Lull

## 2016-06-22 NOTE — Telephone Encounter (Signed)
Patient's daughter notified that script was reprinted and placed up front for pick up/SW

## 2016-07-12 ENCOUNTER — Ambulatory Visit: Payer: PPO | Admitting: Urology

## 2016-07-12 ENCOUNTER — Encounter: Payer: Self-pay | Admitting: Urology

## 2016-07-12 NOTE — Progress Notes (Deleted)
1:21 PM   Denise Matthews 11/20/1920 [id number]  Referring provider: Rusty Aus, MD [place] [facility] [place], [place] [address]  No chief complaint on file.   HPI: Patient is a 82 year old Caucasian female who presents today for 3 month follow-up after she was placed on vaginal estrogen cream for vaginal atrophy and recurrent urinary tract infections.  Background history Patient was referred by her PCP, Denise Reaper PA, or recurrent urinary tract infections.  Patient is a poor historian and presents today with her daughter, Denise Matthews.  Patient's daughter states that she's had 3 urinary tract infections since Christmas.   Her urine cultures have been positive for enterococcus and Escherichia coli.  Her symptoms with a urinary tract infection consist of mental confusion.  Patient's daughter states since 2001, after a complete hysterectomy and vaginal sling, she is had incontinence. Her daughter describes it as an urge incontinence. She states that the patient wears 1 depends during the day and changes it at night before she goes to bed.  The incontinence volume is mild to moderate.  Interval history Her baseline urinary symptoms consist of urgency, nocturia 3, urge incontinence, intermittency and hesitancy.  She does not complain of dysuria, gross hematuria or suprapubic pain.  She does not have a prior history of kidney stone disease.  She is not experiencing fever, chills, nausea or vomiting.  Her daughter states that she has note noted any recent mental confusion.  Hospice is involved.  She had a CT of the abdomen and pelvis in 2011 and it noted a peripelvic cyst involving the left kidney and a right renal cyst. No urinary calculi were identified.  The patient did not experience any vaginal burning or rash with the application of the cream.  Urine culture from 04/12/2016 was positive for E. Coli.   Today,     PMH: Past Medical History:  Diagnosis  Date  . Arthritis   . Atrial fibrillation ([place])   . Heartburn   . HTN (hypertension)   . Recurrent UTI   . Sleep apnea     Surgical History: Past Surgical History:  Procedure Laterality Date  . ABDOMINAL HYSTERECTOMY    . CATARACT EXTRACTION    . ROTATOR CUFF REPAIR    . vaginal sling      Home Medications:    Medication List       Accurate as of 07/12/16  1:21 PM. Always use your most recent med list.          acetaminophen 500 MG tablet Commonly known as:  TYLENOL Take by mouth.   CALCIUM 500/D 500-200 MG-UNIT tablet Generic drug:  calcium-vitamin D Take by mouth.   ciprofloxacin 250 MG tablet Commonly known as:  CIPRO Reported on 04/12/2016   conjugated estrogens vaginal cream Commonly known as:  PREMARIN Place 1 Applicatorful vaginally daily. Apply 0.5mg  (pea-sized amount)  just inside the vaginal introitus with a finger-tip every night for two weeks and then Monday, Wednesday and Friday nights.   docusate sodium 100 MG capsule Commonly known as:  COLACE Take by mouth.   donepezil 5 MG tablet Commonly known as:  ARICEPT Take by mouth.   ESTRACE VAGINAL 0.1 MG/GM vaginal cream Generic drug:  estradiol Place 1 Applicatorful vaginally at bedtime.   estradiol 0.1 MG/GM vaginal cream Commonly known as:  ESTRACE VAGINAL Apply 0.5mg  (pea-sized amount)  just inside the vaginal introitus with a finger-tip every night for two weeks and then Monday,  Wednesday and Friday nights.   metoprolol succinate 50 MG 24 hr tablet Commonly known as:  TOPROL-XL Take 25 mg by mouth.   PRESERVISION AREDS PO Take by mouth 2 (two) times daily.   QUEtiapine 25 MG tablet Commonly known as:  SEROQUEL Take by mouth.   RA VITAMIN B-12 TR 1000 MCG Tbcr Generic drug:  Cyanocobalamin Take by mouth.   traMADol-acetaminophen 37.5-325 MG tablet Commonly known as:  ULTRACET Take by mouth.   trimethoprim 100 MG tablet Commonly known as:  TRIMPEX Take 1 tablet (100 mg total) by  mouth daily.       Allergies:  Allergies  Allergen Reactions  . Cholecalciferol Other (See Comments)  . Nitrofurantoin Macrocrystal Itching and Rash    Family History: Family History  Problem Relation Age of Onset  . Kidney disease Neg Hx   . Bladder Cancer Neg Hx     Social History:  reports that she has never smoked. She does not have any smokeless tobacco history on file. She reports that she does not drink alcohol or use drugs.  ROS:                                        Physical Exam: There were no vitals taken for this visit.  Constitutional: Well nourished. Alert and oriented, No acute distress. HEENT: [place] AT, moist mucus membranes. Trachea midline, no masses. Cardiovascular: No clubbing, cyanosis, or edema. Respiratory: Normal respiratory effort, no increased work of breathing. GI: Abdomen is soft, non tender, non distended, no abdominal masses. Liver and spleen not palpable.  No hernias appreciated.  Stool sample for occult testing is not indicated.   GU: No CVA tenderness.  No bladder fullness or masses.  Atrophic external genitalia, normal pubic hair distribution, external labia with multiple sebaceous cysts.   Urethral caruncle is noted without prolapse or discharge.   No urethral masses, tenderness and/or tenderness. No bladder fullness, tenderness or masses. Atrophic vagina mucosa, poor estrogen effect, no discharge, no lesions, good pelvic support, no cystocele or rectocele noted.  Cervix, uterus and ovaries are surgically absent Anus and perineum are without rashes or lesions.    Skin: No rashes, bruises or suspicious lesions. Lymph: No cervical or inguinal adenopathy. Neurologic: Grossly intact, no focal deficits, moving all 4 extremities. Psychiatric: Normal mood and affect.  Laboratory Data: Lab Results  Component Value Date   WBC 5.0 10/22/2014   HGB 9.8 (L) 10/22/2014   HCT 30.1 (L) 10/22/2014   MCV 88 10/22/2014   PLT 187  10/22/2014    Lab Results  Component Value Date   CREATININE 0.77 10/20/2014   Lab Results  Component Value Date   AST 45 (H) 10/19/2014   Lab Results  Component Value Date   ALT 54 10/19/2014      Assessment & Plan:    1. Recurrent UTI:     2.Vaginal atrophy:     No Follow-up on file.  These notes generated with voice recognition software. I apologize for typographical errors.  Zara Council, [facility] [address] [place], [place] [address] [phone]

## 2016-08-16 ENCOUNTER — Encounter: Payer: Self-pay | Admitting: Urology

## 2016-08-16 ENCOUNTER — Ambulatory Visit (INDEPENDENT_AMBULATORY_CARE_PROVIDER_SITE_OTHER): Payer: PPO | Admitting: Urology

## 2016-08-16 VITALS — BP 177/73 | HR 68 | Ht 63.0 in | Wt 116.0 lb

## 2016-08-16 DIAGNOSIS — N952 Postmenopausal atrophic vaginitis: Secondary | ICD-10-CM | POA: Diagnosis not present

## 2016-08-16 DIAGNOSIS — N390 Urinary tract infection, site not specified: Secondary | ICD-10-CM | POA: Diagnosis not present

## 2016-08-16 LAB — URINALYSIS, COMPLETE
Bilirubin, UA: NEGATIVE
Glucose, UA: NEGATIVE
NITRITE UA: NEGATIVE
PH UA: 6 (ref 5.0–7.5)
Specific Gravity, UA: >1.03 — ABNORMAL HIGH (ref 1.005–1.030)
UUROB: 0.2 mg/dL (ref 0.2–1.0)

## 2016-08-16 LAB — MICROSCOPIC EXAMINATION

## 2016-08-16 NOTE — Progress Notes (Signed)
2:00 PM   Denise Matthews Aug 15, 1920 [id number]  Referring provider: Rusty Aus, MD [facility] [place], [place] [address]  Chief Complaint  Patient presents with  . Recurrent UTI    3 month follow up  . Vaginal Atrophy    HPI: Patient is a 82 year old Caucasian female who presents today for 3 month follow-up after we placed on vaginal estrogen cream for vaginal atrophy and recurrent urinary tract infections.  Background history Patient was referred by her PCP, Boykin Reaper PA, or recurrent urinary tract infections.  Patient is a poor historian and presents today with her daughter, [name].  Patient's daughter states that she's had 3 urinary tract infections since Christmas.   Her urine cultures have been positive for enterococcus and Escherichia coli.  Her symptoms with a urinary tract infection consist of mental confusion.  Patient's daughter states since 2001, after a complete hysterectomy and vaginal sling, she is had incontinence. Her daughter describes it as an urge incontinence. She states that the patient wears 1 depends during the day and changes it at night before she goes to bed.  The incontinence volume is mild to moderate.  Her baseline urinary symptoms consist of urgency, nocturia 3, urge incontinence, intermittency and hesitancy.  She does not complain of dysuria, gross hematuria or suprapubic pain.  She does not have a prior history of kidney stone disease.  She is not experiencing fever, chills, nausea or vomiting.    Her daughter states that she has note noted any recent mental confusion.  Hospice is involved.  Her UA is nitrite positive today.    She had a CT of the abdomen and pelvis in 2011 and it noted a peripelvic cyst involving the left kidney and a right renal cyst. No urinary calculi were identified.  Her daughter has been applying the vaginal cream three night weekly.  Patient does not complain of vaginal burning  or discharge.  She has not had any recent symptoms of an UTI, such as dysuria, gross hematuria, suprapubic pain, fevers, chills or mental status changes.      PMH: Past Medical History:  Diagnosis Date  . Arthritis   . Atrial fibrillation ([place])   . Heartburn   . HTN (hypertension)   . Recurrent UTI   . Sleep apnea   . Vaginal atrophy     Surgical History: Past Surgical History:  Procedure Laterality Date  . ABDOMINAL HYSTERECTOMY    . CATARACT EXTRACTION    . ROTATOR CUFF REPAIR    . vaginal sling      Home Medications:    Medication List       Accurate as of 08/16/16  2:00 PM. Always use your most recent med list.          acetaminophen 500 MG tablet Commonly known as:  TYLENOL Take by mouth.   CALCIUM 500/D 500-200 MG-UNIT tablet Generic drug:  calcium-vitamin D Take by mouth.   ciprofloxacin 250 MG tablet Commonly known as:  CIPRO Reported on 04/12/2016   conjugated estrogens vaginal cream Commonly known as:  PREMARIN Place 1 Applicatorful vaginally daily. Apply 0.5mg  (pea-sized amount)  just inside the vaginal introitus with a finger-tip every night for two weeks and then Monday, Wednesday and Friday nights.   diclofenac sodium 1 % Gel Commonly known as:  VOLTAREN Apply topically.   docusate sodium 100 MG capsule Commonly known as:  COLACE Take by mouth.   donepezil 5 MG  tablet Commonly known as:  ARICEPT Take by mouth.   ESTRACE VAGINAL 0.1 MG/GM vaginal cream Generic drug:  estradiol Place 1 Applicatorful vaginally at bedtime.   estradiol 0.1 MG/GM vaginal cream Commonly known as:  ESTRACE VAGINAL Apply 0.5mg  (pea-sized amount)  just inside the vaginal introitus with a finger-tip every night for two weeks and then Monday, Wednesday and Friday nights.   metoprolol succinate 50 MG 24 hr tablet Commonly known as:  TOPROL-XL Take 25 mg by mouth.   PRESERVISION AREDS PO Take by mouth 2 (two) times daily.   QUEtiapine 25 MG tablet Commonly  known as:  SEROQUEL Take by mouth.   RA VITAMIN B-12 TR 1000 MCG Tbcr Generic drug:  Cyanocobalamin Take by mouth.   senna 8.6 MG tablet Commonly known as:  SENOKOT Take by mouth.   traMADol-acetaminophen 37.5-325 MG tablet Commonly known as:  ULTRACET Take by mouth.   trimethoprim 100 MG tablet Commonly known as:  TRIMPEX Take 1 tablet (100 mg total) by mouth daily.       Allergies:  Allergies  Allergen Reactions  . Cholecalciferol Other (See Comments)  . Nitrofurantoin Macrocrystal Itching and Rash    Family History: Family History  Problem Relation Age of Onset  . Kidney disease Neg Hx   . Bladder Cancer Neg Hx     Social History:  reports that she has never smoked. She has never used smokeless tobacco. She reports that she does not drink alcohol or use drugs.  ROS: UROLOGY Frequent Urination?: No Hard to postpone urination?: Yes Burning/pain with urination?: No Get up at night to urinate?: Yes Leakage of urine?: Yes Urine stream starts and stops?: No Trouble starting stream?: No Do you have to strain to urinate?: No Blood in urine?: No Urinary tract infection?: Yes Sexually transmitted disease?: No Injury to kidneys or bladder?: No Painful intercourse?: No Weak stream?: No Currently pregnant?: No Vaginal bleeding?: No  Gastrointestinal Nausea?: No Vomiting?: No Indigestion/heartburn?: No Diarrhea?: No Constipation?: No  Constitutional Fever: No Night sweats?: No Weight loss?: No Fatigue?: No  Skin Skin rash/lesions?: No Itching?: No  Eyes Blurred vision?: No Double vision?: No  Ears/Nose/Throat Sore throat?: No Sinus problems?: No  Hematologic/Lymphatic Swollen glands?: No Easy bruising?: No  Cardiovascular Leg swelling?: No Chest pain?: No  Respiratory Cough?: No Shortness of breath?: No  Endocrine Excessive thirst?: No  Musculoskeletal Back pain?: Yes Joint pain?: Yes  Neurological Headaches?: No Dizziness?:  No  Psychologic Depression?: No Anxiety?: No  Physical Exam: BP (!) 177/73   Pulse 68   Ht 5\' 3"  (1.6 m)   Wt 116 lb (52.6 kg)   BMI 20.55 kg/m   Constitutional: Well nourished. Alert and oriented, No acute distress. HEENT: [place] AT, moist mucus membranes. Trachea midline, no masses. Cardiovascular: No clubbing, cyanosis, or edema. Respiratory: Normal respiratory effort, no increased work of breathing. GI: Abdomen is soft, non tender, non distended, no abdominal masses. Liver and spleen not palpable.  No hernias appreciated.  Stool sample for occult testing is not indicated.   GU: No CVA tenderness.  No bladder fullness or masses.  Atrophic external genitalia, normal pubic hair distribution, external labia with multiple sebaceous cysts.   Urethral caruncle is noted without prolapse or discharge.   No urethral masses, tenderness and/or tenderness. No bladder fullness, tenderness or masses. Atrophic vagina mucosa, poor estrogen effect, no discharge, no lesions, good pelvic support, no cystocele or rectocele noted.  Cervix, uterus and ovaries are surgically absent Anus and perineum  are without rashes or lesions.    Skin: No rashes, bruises or suspicious lesions. Lymph: No cervical or inguinal adenopathy. Neurologic: Grossly intact, no focal deficits, moving all 4 extremities. Psychiatric: Normal mood and affect.  Laboratory Data: Lab Results  Component Value Date   WBC 5.0 10/22/2014   HGB 9.8 (L) 10/22/2014   HCT 30.1 (L) 10/22/2014   MCV 88 10/22/2014   PLT 187 10/22/2014    Lab Results  Component Value Date   CREATININE 0.77 10/20/2014   Lab Results  Component Value Date   AST 45 (H) 10/19/2014   Lab Results  Component Value Date   ALT 54 10/19/2014     Assessment & Plan:    1. Recurrent UTI:  Patient not symptomatic at this time.   Emphasized the symptoms of an UTI and the need to RTC for CATH UA and cultures.    2.Vaginal atrophy:  Patient's daughter will continue  the cream three nights weekly.  Sample was given at this time of [place].  They will follow up in one year for exam.      Return in about 1 year (around 08/16/2017) for exam.  These notes generated with voice recognition software. I apologize for typographical errors.  Zara Council, [facility] [address], [place], [place] [address] [phone]

## 2016-08-30 DIAGNOSIS — S51811A Laceration without foreign body of right forearm, initial encounter: Secondary | ICD-10-CM | POA: Diagnosis not present

## 2016-09-09 DIAGNOSIS — H353233 Exudative age-related macular degeneration, bilateral, with inactive scar: Secondary | ICD-10-CM | POA: Diagnosis not present

## 2016-09-28 DIAGNOSIS — R350 Frequency of micturition: Secondary | ICD-10-CM | POA: Diagnosis not present

## 2016-09-28 DIAGNOSIS — R300 Dysuria: Secondary | ICD-10-CM | POA: Diagnosis not present

## 2016-09-30 DIAGNOSIS — I10 Essential (primary) hypertension: Secondary | ICD-10-CM | POA: Diagnosis not present

## 2016-09-30 DIAGNOSIS — F0391 Unspecified dementia with behavioral disturbance: Secondary | ICD-10-CM | POA: Diagnosis not present

## 2016-09-30 DIAGNOSIS — Z1283 Encounter for screening for malignant neoplasm of skin: Secondary | ICD-10-CM | POA: Diagnosis not present

## 2016-09-30 DIAGNOSIS — R4182 Altered mental status, unspecified: Secondary | ICD-10-CM | POA: Diagnosis not present

## 2016-09-30 DIAGNOSIS — H9190 Unspecified hearing loss, unspecified ear: Secondary | ICD-10-CM | POA: Diagnosis not present

## 2016-09-30 DIAGNOSIS — Z23 Encounter for immunization: Secondary | ICD-10-CM | POA: Diagnosis not present

## 2016-09-30 DIAGNOSIS — M810 Age-related osteoporosis without current pathological fracture: Secondary | ICD-10-CM | POA: Diagnosis not present

## 2016-09-30 DIAGNOSIS — I480 Paroxysmal atrial fibrillation: Secondary | ICD-10-CM | POA: Diagnosis not present

## 2016-10-04 DIAGNOSIS — R300 Dysuria: Secondary | ICD-10-CM | POA: Diagnosis not present

## 2016-10-28 DIAGNOSIS — R634 Abnormal weight loss: Secondary | ICD-10-CM | POA: Diagnosis not present

## 2016-10-28 DIAGNOSIS — F0391 Unspecified dementia with behavioral disturbance: Secondary | ICD-10-CM | POA: Diagnosis not present

## 2016-11-10 DIAGNOSIS — L821 Other seborrheic keratosis: Secondary | ICD-10-CM | POA: Diagnosis not present

## 2016-11-10 DIAGNOSIS — D2272 Melanocytic nevi of left lower limb, including hip: Secondary | ICD-10-CM | POA: Diagnosis not present

## 2016-11-10 DIAGNOSIS — D044 Carcinoma in situ of skin of scalp and neck: Secondary | ICD-10-CM | POA: Diagnosis not present

## 2016-11-10 DIAGNOSIS — D485 Neoplasm of uncertain behavior of skin: Secondary | ICD-10-CM | POA: Diagnosis not present

## 2016-11-10 DIAGNOSIS — D225 Melanocytic nevi of trunk: Secondary | ICD-10-CM | POA: Diagnosis not present

## 2016-11-10 DIAGNOSIS — D2261 Melanocytic nevi of right upper limb, including shoulder: Secondary | ICD-10-CM | POA: Diagnosis not present

## 2016-11-28 DIAGNOSIS — D044 Carcinoma in situ of skin of scalp and neck: Secondary | ICD-10-CM | POA: Diagnosis not present

## 2016-12-12 DIAGNOSIS — J189 Pneumonia, unspecified organism: Secondary | ICD-10-CM

## 2016-12-12 HISTORY — DX: Pneumonia, unspecified organism: J18.9

## 2016-12-21 DIAGNOSIS — R509 Fever, unspecified: Secondary | ICD-10-CM | POA: Diagnosis not present

## 2016-12-21 DIAGNOSIS — N390 Urinary tract infection, site not specified: Secondary | ICD-10-CM | POA: Diagnosis not present

## 2016-12-21 DIAGNOSIS — R5383 Other fatigue: Secondary | ICD-10-CM | POA: Diagnosis not present

## 2016-12-22 ENCOUNTER — Emergency Department: Payer: PPO

## 2016-12-22 ENCOUNTER — Inpatient Hospital Stay
Admission: EM | Admit: 2016-12-22 | Discharge: 2016-12-30 | DRG: 871 | Disposition: A | Discharge status: Skilled Nursing Facility | Payer: PPO | Attending: Internal Medicine | Admitting: Internal Medicine

## 2016-12-22 ENCOUNTER — Encounter: Payer: Self-pay | Admitting: *Deleted

## 2016-12-22 DIAGNOSIS — N390 Urinary tract infection, site not specified: Secondary | ICD-10-CM | POA: Diagnosis not present

## 2016-12-22 DIAGNOSIS — Z7401 Bed confinement status: Secondary | ICD-10-CM | POA: Diagnosis not present

## 2016-12-22 DIAGNOSIS — N201 Calculus of ureter: Secondary | ICD-10-CM

## 2016-12-22 DIAGNOSIS — Z809 Family history of malignant neoplasm, unspecified: Secondary | ICD-10-CM

## 2016-12-22 DIAGNOSIS — I4891 Unspecified atrial fibrillation: Secondary | ICD-10-CM | POA: Diagnosis present

## 2016-12-22 DIAGNOSIS — A419 Sepsis, unspecified organism: Secondary | ICD-10-CM | POA: Diagnosis present

## 2016-12-22 DIAGNOSIS — Z9849 Cataract extraction status, unspecified eye: Secondary | ICD-10-CM | POA: Diagnosis not present

## 2016-12-22 DIAGNOSIS — B9629 Other Escherichia coli [E. coli] as the cause of diseases classified elsewhere: Secondary | ICD-10-CM | POA: Diagnosis not present

## 2016-12-22 DIAGNOSIS — Z90710 Acquired absence of both cervix and uterus: Secondary | ICD-10-CM

## 2016-12-22 DIAGNOSIS — I10 Essential (primary) hypertension: Secondary | ICD-10-CM | POA: Diagnosis not present

## 2016-12-22 DIAGNOSIS — R1312 Dysphagia, oropharyngeal phase: Secondary | ICD-10-CM | POA: Diagnosis not present

## 2016-12-22 DIAGNOSIS — G4730 Sleep apnea, unspecified: Secondary | ICD-10-CM | POA: Diagnosis not present

## 2016-12-22 DIAGNOSIS — Z888 Allergy status to other drugs, medicaments and biological substances status: Secondary | ICD-10-CM | POA: Diagnosis not present

## 2016-12-22 DIAGNOSIS — R509 Fever, unspecified: Secondary | ICD-10-CM

## 2016-12-22 DIAGNOSIS — N135 Crossing vessel and stricture of ureter without hydronephrosis: Secondary | ICD-10-CM | POA: Diagnosis present

## 2016-12-22 DIAGNOSIS — Z8249 Family history of ischemic heart disease and other diseases of the circulatory system: Secondary | ICD-10-CM

## 2016-12-22 DIAGNOSIS — R0989 Other specified symptoms and signs involving the circulatory and respiratory systems: Secondary | ICD-10-CM | POA: Diagnosis not present

## 2016-12-22 DIAGNOSIS — Z66 Do not resuscitate: Secondary | ICD-10-CM | POA: Diagnosis present

## 2016-12-22 DIAGNOSIS — R7881 Bacteremia: Secondary | ICD-10-CM | POA: Diagnosis not present

## 2016-12-22 DIAGNOSIS — R262 Difficulty in walking, not elsewhere classified: Secondary | ICD-10-CM

## 2016-12-22 DIAGNOSIS — J40 Bronchitis, not specified as acute or chronic: Secondary | ICD-10-CM | POA: Diagnosis not present

## 2016-12-22 DIAGNOSIS — Z87440 Personal history of urinary (tract) infections: Secondary | ICD-10-CM | POA: Diagnosis not present

## 2016-12-22 DIAGNOSIS — J189 Pneumonia, unspecified organism: Secondary | ICD-10-CM | POA: Diagnosis not present

## 2016-12-22 DIAGNOSIS — F0390 Unspecified dementia without behavioral disturbance: Secondary | ICD-10-CM | POA: Diagnosis not present

## 2016-12-22 DIAGNOSIS — R4182 Altered mental status, unspecified: Secondary | ICD-10-CM | POA: Diagnosis not present

## 2016-12-22 DIAGNOSIS — Z5189 Encounter for other specified aftercare: Secondary | ICD-10-CM | POA: Diagnosis not present

## 2016-12-22 DIAGNOSIS — M1990 Unspecified osteoarthritis, unspecified site: Secondary | ICD-10-CM | POA: Diagnosis present

## 2016-12-22 DIAGNOSIS — R41841 Cognitive communication deficit: Secondary | ICD-10-CM | POA: Diagnosis not present

## 2016-12-22 DIAGNOSIS — G9341 Metabolic encephalopathy: Secondary | ICD-10-CM | POA: Diagnosis present

## 2016-12-22 DIAGNOSIS — L8990 Pressure ulcer of unspecified site, unspecified stage: Secondary | ICD-10-CM | POA: Insufficient documentation

## 2016-12-22 DIAGNOSIS — Z789 Other specified health status: Secondary | ICD-10-CM | POA: Diagnosis not present

## 2016-12-22 DIAGNOSIS — J181 Lobar pneumonia, unspecified organism: Secondary | ICD-10-CM

## 2016-12-22 DIAGNOSIS — M6281 Muscle weakness (generalized): Secondary | ICD-10-CM

## 2016-12-22 DIAGNOSIS — F0150 Vascular dementia without behavioral disturbance: Secondary | ICD-10-CM | POA: Diagnosis not present

## 2016-12-22 DIAGNOSIS — A4151 Sepsis due to Escherichia coli [E. coli]: Secondary | ICD-10-CM | POA: Diagnosis not present

## 2016-12-22 DIAGNOSIS — Z823 Family history of stroke: Secondary | ICD-10-CM | POA: Diagnosis not present

## 2016-12-22 DIAGNOSIS — N132 Hydronephrosis with renal and ureteral calculous obstruction: Secondary | ICD-10-CM | POA: Diagnosis not present

## 2016-12-22 DIAGNOSIS — N200 Calculus of kidney: Secondary | ICD-10-CM | POA: Diagnosis not present

## 2016-12-22 DIAGNOSIS — D510 Vitamin B12 deficiency anemia due to intrinsic factor deficiency: Secondary | ICD-10-CM | POA: Diagnosis not present

## 2016-12-22 HISTORY — DX: Unspecified dementia, unspecified severity, without behavioral disturbance, psychotic disturbance, mood disturbance, and anxiety: F03.90

## 2016-12-22 HISTORY — DX: Sepsis, unspecified organism: A41.9

## 2016-12-22 LAB — URINALYSIS, ROUTINE W REFLEX MICROSCOPIC
Bilirubin Urine: NEGATIVE
GLUCOSE, UA: NEGATIVE mg/dL
Hgb urine dipstick: NEGATIVE
KETONES UR: NEGATIVE mg/dL
Leukocytes, UA: NEGATIVE
Nitrite: NEGATIVE
PH: 5 (ref 5.0–8.0)
PROTEIN: 30 mg/dL — AB
Specific Gravity, Urine: 1.014 (ref 1.005–1.030)

## 2016-12-22 LAB — COMPREHENSIVE METABOLIC PANEL
ALBUMIN: 3.2 g/dL — AB (ref 3.5–5.0)
ALT: 28 U/L (ref 14–54)
AST: 43 U/L — AB (ref 15–41)
Alkaline Phosphatase: 90 U/L (ref 38–126)
Anion gap: 9 (ref 5–15)
BUN: 25 mg/dL — AB (ref 6–20)
CHLORIDE: 103 mmol/L (ref 101–111)
CO2: 29 mmol/L (ref 22–32)
CREATININE: 0.76 mg/dL (ref 0.44–1.00)
Calcium: 9 mg/dL (ref 8.9–10.3)
GFR calc Af Amer: >60 mL/min (ref >60)
GFR calc non Af Amer: >60 mL/min (ref >60)
GLUCOSE: 117 mg/dL — AB (ref 65–99)
Potassium: 3.3 mmol/L — ABNORMAL LOW (ref 3.5–5.1)
SODIUM: 141 mmol/L (ref 135–145)
Total Bilirubin: 0.8 mg/dL (ref 0.3–1.2)
Total Protein: 6.7 g/dL (ref 6.5–8.1)

## 2016-12-22 LAB — CBC WITH DIFFERENTIAL/PLATELET
Basophils Absolute: 0 10*3/uL (ref 0–0.1)
Basophils Relative: 0 %
EOS ABS: 0 10*3/uL (ref 0–0.7)
EOS PCT: 0 %
HCT: 33.6 % — ABNORMAL LOW (ref 35.0–47.0)
Hemoglobin: 11.4 g/dL — ABNORMAL LOW (ref 12.0–16.0)
LYMPHS ABS: 0.2 10*3/uL — AB (ref 1.0–3.6)
LYMPHS PCT: 2 %
MCH: 29.2 pg (ref 26.0–34.0)
MCHC: 33.8 g/dL (ref 32.0–36.0)
MCV: 86.3 fL (ref 80.0–100.0)
MONOS PCT: 1 %
Monocytes Absolute: 0 10*3/uL — ABNORMAL LOW (ref 0.2–0.9)
Neutro Abs: 6.4 10*3/uL (ref 1.4–6.5)
Neutrophils Relative %: 97 %
PLATELETS: 165 10*3/uL (ref 150–440)
RBC: 3.89 MIL/uL (ref 3.80–5.20)
RDW: 14.4 % (ref 11.5–14.5)
WBC: 6.6 10*3/uL (ref 3.6–11.0)

## 2016-12-22 LAB — CK: CK TOTAL: 56 U/L (ref 38–234)

## 2016-12-22 LAB — TSH: TSH: 3.963 u[IU]/mL (ref 0.350–4.500)

## 2016-12-22 LAB — INFLUENZA PANEL BY PCR (TYPE A & B)
INFLAPCR: NEGATIVE
Influenza B By PCR: NEGATIVE

## 2016-12-22 LAB — LACTIC ACID, PLASMA: LACTIC ACID, VENOUS: 1.7 mmol/L (ref 0.5–1.9)

## 2016-12-22 MED ORDER — ACETAMINOPHEN 650 MG RE SUPP
650.0000 mg | Freq: Four times a day (QID) | RECTAL | Status: DC | PRN
  Administered 2016-12-25 – 2016-12-28 (×3): 650 mg via RECTAL
  Filled 2016-12-22 (×3): qty 1

## 2016-12-22 MED ORDER — DEXTROSE 5 % IV SOLN
1.0000 g | Freq: Once | INTRAVENOUS | Status: DC
  Filled 2016-12-22: qty 10

## 2016-12-22 MED ORDER — IPRATROPIUM-ALBUTEROL 0.5-2.5 (3) MG/3ML IN SOLN: 3.0000 mL | RESPIRATORY_TRACT | Status: DC | PRN

## 2016-12-22 MED ORDER — ONDANSETRON HCL 4 MG PO TABS
4.0000 mg | ORAL_TABLET | Freq: Four times a day (QID) | ORAL | Status: DC | PRN
Start: 2016-12-22 — End: 2016-12-30

## 2016-12-22 MED ORDER — DONEPEZIL HCL 5 MG PO TABS
5.0000 mg | ORAL_TABLET | Freq: Every day | ORAL | Status: DC
  Administered 2016-12-23 – 2016-12-29 (×7): 5 mg via ORAL
  Filled 2016-12-22 (×7): qty 1

## 2016-12-22 MED ORDER — ACETAMINOPHEN 325 MG PO TABS
650.0000 mg | ORAL_TABLET | Freq: Four times a day (QID) | ORAL | Status: DC | PRN
  Filled 2016-12-22: qty 2

## 2016-12-22 MED ORDER — DEXTROSE 5 % IV SOLN: 1.0000 g | Freq: Once | INTRAVENOUS | Status: DC

## 2016-12-22 MED ORDER — ACETAMINOPHEN 650 MG RE SUPP
650.0000 mg | Freq: Once | RECTAL | Status: AC
  Administered 2016-12-22: 650 mg via RECTAL
  Filled 2016-12-22: qty 1

## 2016-12-22 MED ORDER — ONDANSETRON HCL 4 MG/2ML IJ SOLN: 4.0000 mg | Freq: Four times a day (QID) | INTRAMUSCULAR | Status: DC | PRN

## 2016-12-22 MED ORDER — BENZONATATE 100 MG PO CAPS
200.0000 mg | ORAL_CAPSULE | Freq: Three times a day (TID) | ORAL | Status: DC | PRN
Start: 2016-12-22 — End: 2016-12-30
  Administered 2016-12-25: 200 mg via ORAL
  Filled 2016-12-22: qty 2

## 2016-12-22 MED ORDER — QUETIAPINE FUMARATE 25 MG PO TABS
25.0000 mg | ORAL_TABLET | Freq: Every day | ORAL | Status: DC
  Administered 2016-12-23 – 2016-12-29 (×7): 25 mg via ORAL
  Filled 2016-12-22 (×7): qty 1

## 2016-12-22 MED ORDER — VANCOMYCIN HCL IN DEXTROSE 1-5 GM/200ML-% IV SOLN
1000.0000 mg | Freq: Once | INTRAVENOUS | Status: AC
  Administered 2016-12-22: 1000 mg via INTRAVENOUS
  Filled 2016-12-22: qty 200

## 2016-12-22 MED ORDER — METOPROLOL SUCCINATE ER 25 MG PO TB24
25.0000 mg | ORAL_TABLET | Freq: Every day | ORAL | Status: DC
  Administered 2016-12-23 – 2016-12-30 (×7): 25 mg via ORAL
  Filled 2016-12-22 (×8): qty 1

## 2016-12-22 MED ORDER — SODIUM CHLORIDE 0.9 % IV SOLN
Freq: Once | INTRAVENOUS | Status: AC
  Administered 2016-12-22: 19:00:00 via INTRAVENOUS

## 2016-12-22 MED ORDER — ENOXAPARIN SODIUM 40 MG/0.4ML ~~LOC~~ SOLN
40.0000 mg | SUBCUTANEOUS | Status: DC
  Administered 2016-12-23 – 2016-12-30 (×8): 40 mg via SUBCUTANEOUS
  Filled 2016-12-22 (×8): qty 0.4

## 2016-12-22 MED ORDER — PIPERACILLIN-TAZOBACTAM 3.375 G IVPB 30 MIN
3.3750 g | Freq: Once | INTRAVENOUS | Status: AC
  Administered 2016-12-22: 3.375 g via INTRAVENOUS
  Filled 2016-12-22: qty 50

## 2016-12-22 NOTE — ED Provider Notes (Addendum)
[facility] Emergency Department Provider Note   ____________________________________________   First MD Initiated Contact with Patient 12/22/16 1850     (approximate)  I have reviewed the triage vital signs and the nursing notes.   HISTORY  Chief Complaint Altered Mental Status  History Limited by altered mental status  HPI Denise Matthews is a 83 y.o. female patient arrives from home from EMS. Patient apparently had an episode last week of altered mental status and fever which resolved. She had the same thing today beginning just a few hours prior to her arrival. There was some question about her not coming to the emergency room. History is obtained from EMS and not from anyone else. Patient did not have any history of nausea vomiting diarrhea  cough or shortness of breath.  Nurse reports urine was just pus. Past Medical History:  Diagnosis Date  . Arthritis   . Atrial fibrillation ([place])   . Heartburn   . HTN (hypertension)   . Recurrent UTI   . Sleep apnea   . Vaginal atrophy     Patient Active Problem List   Diagnosis Date Noted  . Recurrent UTI 04/03/2016  . Vaginal atrophy 04/03/2016    Past Surgical History:  Procedure Laterality Date  . ABDOMINAL HYSTERECTOMY    . CATARACT EXTRACTION    . ROTATOR CUFF REPAIR    . vaginal sling      Prior to Admission medications   Medication Sig Start Date End Date Taking? Authorizing Provider  acetaminophen (TYLENOL) 500 MG tablet Take by mouth.   Yes Historical Provider, MD  calcium-vitamin D (CALCIUM 500/D) 500-200 MG-UNIT tablet Take 1 tablet by mouth 2 (two) times daily.    Yes Historical Provider, MD  cefUROXime (CEFTIN) 250 MG tablet Take 250 mg by mouth 2 (two) times daily with a meal. For 7 days   Yes Historical Provider, MD  conjugated estrogens (PREMARIN) vaginal cream Place vaginally daily. Apply 0.5mg  (pea-sized amount)  just inside the vaginal introitus with a finger-tip every night  for two weeks and then Monday, Wednesday and Friday nights.   Yes Historical Provider, MD  Cyanocobalamin (RA VITAMIN B-12 TR) 1000 MCG TBCR Take 1 tablet by mouth daily.    Yes Historical Provider, MD  D-MANNOSE PO Take 1 capsule by mouth 2 (two) times daily.   Yes Historical Provider, MD  donepezil (ARICEPT) 5 MG tablet Take 5 mg by mouth at bedtime.  01/05/16  Yes Historical Provider, MD  metoprolol succinate (TOPROL-XL) 50 MG 24 hr tablet Take 25 mg by mouth daily.  01/05/16 12/22/16 Yes Historical Provider, MD  Multiple Vitamins-Minerals (PRESERVISION AREDS PO) Take 1 capsule by mouth 2 (two) times daily.    Yes Historical Provider, MD  QUEtiapine (SEROQUEL) 25 MG tablet Take 25 mg by mouth at bedtime.  01/05/16  Yes Historical Provider, MD  sennosides-docusate sodium (SENOKOT-S) 8.6-50 MG tablet Take 2 tablets by mouth 2 (two) times daily.    Yes Historical Provider, MD  traMADol-acetaminophen (ULTRACET) 37.5-325 MG tablet Take 0.5-1 tablets by mouth every 6 (six) hours as needed.  02/12/16  Yes Historical Provider, MD    Allergies Cholecalciferol and Nitrofurantoin macrocrystal  Family History  Problem Relation Age of Onset  . Kidney disease Neg Hx   . Bladder Cancer Neg Hx     Social History Social History  Substance Use Topics  . Smoking status: Never Smoker  . Smokeless tobacco: Never Used  . Alcohol use No  Review of Systems  Unable to obtain ____________________________________________   PHYSICAL EXAM:  VITAL SIGNS: ED Triage Vitals [12/22/16 1853]  Enc Vitals Group     BP (!) 144/92     Pulse Rate 99     Resp 20     Temp      Temp src      SpO2 92 %     Weight 140 lb (63.5 kg)     Height 5\' 5"  (1.651 m)     Head Circumference      Peak Flow      Pain Score      Pain Loc      Pain Edu?      Excl. in [place]?     Constitutional:Patient is very stiff all of her joints are stiff had arms legs back etc. Patient moans a little bit does not follow instructions  feels hot Eyes: Unable to evaluate eyes his patient holds them shut very tight Head: Atraumatic. Nose: No congestion/rhinnorhea. Mouth/Throat: Mucous membranes are moist.  Oropharynx non-erythematous. Neck: No stridor.  No palpable adenopathy  Cardiovascular: normal heart sounds.  Good peripheral circulation. Respiratory: Normal respiratory effort.  No retractions. Lungs CTAB. Gastrointestinal: Soft and nontender. No distention. No abdominal bruits. No CVA tenderness. Musculoskeletal: No lower extremity tenderness nor edema.  No joint effusions. Stiff   ____________________________________________   LABS (all labs ordered are listed, but only abnormal results are displayed)  Labs Reviewed  COMPREHENSIVE METABOLIC PANEL - Abnormal; Notable for the following:       Result Value   Potassium 3.3 (*)    Glucose, Bld 117 (*)    BUN 25 (*)    Albumin 3.2 (*)    AST 43 (*)    All other components within normal limits  CBC WITH DIFFERENTIAL/PLATELET - Abnormal; Notable for the following:    Hemoglobin 11.4 (*)    HCT 33.6 (*)    Lymphs Abs 0.2 (*)    Monocytes Absolute 0.0 (*)    All other components within normal limits  URINALYSIS, ROUTINE W REFLEX MICROSCOPIC - Abnormal; Notable for the following:    Color, Urine YELLOW (*)    APPearance TURBID (*)    Protein, ur 30 (*)    Bacteria, UA MANY (*)    Squamous Epithelial / LPF TOO NUMEROUS TO COUNT (*)    All other components within normal limits  BLOOD GAS, VENOUS - Abnormal; Notable for the following:    pH, Ven 7.49 (*)    pCO2, Ven 40 (*)    Bicarbonate 30.5 (*)    Acid-Base Excess 6.6 (*)    All other components within normal limits  CULTURE, BLOOD (ROUTINE X 2)  CULTURE, BLOOD (ROUTINE X 2)  URINE CULTURE  TSH  LACTIC ACID, PLASMA  CK  INFLUENZA PANEL BY PCR (TYPE A & B, H1N1)  LACTIC ACID, PLASMA  URINALYSIS, COMPLETE (UACMP) WITH MICROSCOPIC   ____________________________________________  EKG  EKG read and  interpreted by me shows sinus tachycardia rate of 103 normal axis no acute ST-T wave changes ____________________________________________  RADIOLOGY  Study Result   CLINICAL DATA:  Acute onset of fever and lethargy. Initial encounter.  EXAM: PORTABLE CHEST 1 VIEW  COMPARISON:  Chest radiograph performed 10/19/2014  FINDINGS: The lungs are well-aerated. Mild vascular congestion is noted. There is mild elevation of the left hemidiaphragm. Mild right basilar airspace opacity may reflect pneumonia. There is no evidence of pleural effusion or pneumothorax.  The cardiomediastinal silhouette is borderline  enlarged. No acute osseous abnormalities are seen. Degenerative change is noted at the glenohumeral joints bilaterally.  IMPRESSION: Mild vascular congestion and borderline cardiomegaly. Mild elevation of the left hemidiaphragm. Mild right basilar airspace opacity may reflect pneumonia.   Electronically Signed   By: Garald Balding M.D.   On: 12/22/2016 20:25     ____________________________________________   PROCEDURES  Procedure(s) performed:   Procedures  Critical Care performed:   ____________________________________________   INITIAL IMPRESSION / ASSESSMENT AND PLAN / ED COURSE  Pertinent labs & imaging results that were available during my care of the patient were reviewed by me and considered in my medical decision making (see chart for details).  Daughter arrives soon after patient comes. Reports patient is taking cefuroxime. Patient had a episode last week of a low-grade temperature 90.9 only no other symptoms. She did not go to the hospital. Cefuroxime is for UTI.  Clinical Course   At 755 patient is now awake she knows she is in the hospital she talks and follows commands. She is not as stiff either. ____________________________________________   FINAL CLINICAL IMPRESSION(S) / ED DIAGNOSES  Final diagnoses:  Altered mental status,  unspecified altered mental status type  Community acquired pneumonia of right upper lobe of lung ([place])  Sepsis, due to unspecified organism [facility])      NEW MEDICATIONS STARTED DURING THIS VISIT:  New Prescriptions   No medications on file     Note:  This document was prepared using Dragon voice recognition software and may include unintentional dictation errors.    Nena Polio, MD 12/22/16 2035    Nena Polio, MD 12/22/16 [phone]

## 2016-12-22 NOTE — ED Triage Notes (Signed)
Pt brought in via ems from home with altered mental status.  md in with pt.  Pt lethargic and hot to touch.  Iv in place.

## 2016-12-22 NOTE — ED Notes (Signed)
Pt sleeping  Family at bedside.  Pt waiting on admission bed.

## 2016-12-22 NOTE — ED Notes (Signed)
Pt with fever, lethargic on arrival to er.  Pt is from home. md at bedside.  2nd iv starrted and cath ua to lab.  Daughter states pt dx with uti yesterday.  Pt moans out,doesnt answer questions.  Pt placed on 2l liters oxygen nasal cannula.     nsr on monitor.

## 2016-12-22 NOTE — H&P (Signed)
[facility] [place] NAME: Denise Matthews    MR#:  [id number]  DATE OF BIRTH:  [birth year]  DATE OF ADMISSION:  12/22/2016  PRIMARY CARE PHYSICIAN: Rusty Aus, MD   REQUESTING/REFERRING PHYSICIAN: Cinda Quest, MD  CHIEF COMPLAINT:   Chief Complaint  Patient presents with  . Altered Mental Status    HISTORY OF PRESENT ILLNESS:  Denise Matthews  is a 83 y.o. female who presents with A couple days of progressively increasing confusion. She was taken to outpatient clinic and diagnosed with UTI yesterday, and started on antibiotics. However, her symptoms today progressed and she had high fevers and was brought to the ED for evaluation. Here she was found to be septic with likely pneumonia on her x-ray. Hospitalists were called for admission  PAST MEDICAL HISTORY:   Past Medical History:  Diagnosis Date  . Arthritis   . Atrial fibrillation ([place])   . Dementia   . Heartburn   . HTN (hypertension)   . Recurrent UTI   . Sleep apnea   . Vaginal atrophy     PAST SURGICAL HISTORY:   Past Surgical History:  Procedure Laterality Date  . ABDOMINAL HYSTERECTOMY    . CATARACT EXTRACTION    . ROTATOR CUFF REPAIR    . vaginal sling      SOCIAL HISTORY:   Social History  Substance Use Topics  . Smoking status: Never Smoker  . Smokeless tobacco: Never Used  . Alcohol use No    FAMILY HISTORY:   Family History  Problem Relation Age of Onset  . Heart disease Father   . Stroke Father   . Cancer Brother   . Kidney disease Neg Hx   . Bladder Cancer Neg Hx     DRUG ALLERGIES:   Allergies  Allergen Reactions  . Cholecalciferol Other (See Comments)  . Nitrofurantoin Macrocrystal Itching and Rash    MEDICATIONS AT HOME:   Prior to Admission medications   Medication Sig Start Date End Date Taking? Authorizing Provider  acetaminophen (TYLENOL) 500 MG tablet Take by mouth.   Yes Historical Provider, MD  calcium-vitamin D (CALCIUM  500/D) 500-200 MG-UNIT tablet Take 1 tablet by mouth 2 (two) times daily.    Yes Historical Provider, MD  cefUROXime (CEFTIN) 250 MG tablet Take 250 mg by mouth 2 (two) times daily with a meal. For 7 days   Yes Historical Provider, MD  conjugated estrogens (PREMARIN) vaginal cream Place vaginally daily. Apply 0.5mg  (pea-sized amount)  just inside the vaginal introitus with a finger-tip every night for two weeks and then Monday, Wednesday and Friday nights.   Yes Historical Provider, MD  Cyanocobalamin (RA VITAMIN B-12 TR) 1000 MCG TBCR Take 1 tablet by mouth daily.    Yes Historical Provider, MD  D-MANNOSE PO Take 1 capsule by mouth 2 (two) times daily.   Yes Historical Provider, MD  donepezil (ARICEPT) 5 MG tablet Take 5 mg by mouth at bedtime.  01/05/16  Yes Historical Provider, MD  metoprolol succinate (TOPROL-XL) 50 MG 24 hr tablet Take 25 mg by mouth daily.  01/05/16 12/22/16 Yes Historical Provider, MD  Multiple Vitamins-Minerals (PRESERVISION AREDS PO) Take 1 capsule by mouth 2 (two) times daily.    Yes Historical Provider, MD  QUEtiapine (SEROQUEL) 25 MG tablet Take 25 mg by mouth at bedtime.  01/05/16  Yes Historical Provider, MD  sennosides-docusate sodium (SENOKOT-S) 8.6-50 MG tablet Take 2 tablets by mouth 2 (two) times daily.  Yes Historical Provider, MD  traMADol-acetaminophen (ULTRACET) 37.5-325 MG tablet Take 0.5-1 tablets by mouth every 6 (six) hours as needed.  02/12/16  Yes Historical Provider, MD    REVIEW OF SYSTEMS:  Review of Systems  Unable to perform ROS: Acuity of condition     VITAL SIGNS:   Vitals:   12/22/16 2110 12/22/16 2116 12/22/16 2120 12/22/16 2130  BP: (!) 115/56 (!) 146/67  (!) 116/57  Pulse: 98 (!) 101 98 87  Resp:  14 12 14   Temp:  97.9 F (36.6 C)    TempSrc:  Oral    SpO2: 96% 98% 96% 95%  Weight:      Height:       Wt Readings from Last 3 Encounters:  12/22/16 63.5 kg (140 lb)  08/16/16 52.6 kg (116 lb)  04/12/16 53.7 kg (118 lb 4.8 oz)     PHYSICAL EXAMINATION:  Physical Exam  Vitals reviewed. Constitutional: She appears well-developed and well-nourished. No distress.  HENT:  Head: Normocephalic and atraumatic.  Mouth/Throat: Oropharynx is clear and moist.  Eyes: Conjunctivae and EOM are normal. Pupils are equal, round, and reactive to light. No scleral icterus.  Neck: Normal range of motion. Neck supple. No JVD present. No thyromegaly present.  Cardiovascular: Normal rate, regular rhythm and intact distal pulses.  Exam reveals no gallop and no friction rub.   No murmur heard. Respiratory: Effort normal. No respiratory distress. She has no wheezes. She has no rales.  Coarse basilar breath sounds  GI: Soft. Bowel sounds are normal. She exhibits no distension. There is no tenderness.  Musculoskeletal: Normal range of motion. She exhibits no edema.  No arthritis, no gout  Lymphadenopathy:    She has no cervical adenopathy.  Neurological: No cranial nerve deficit.  Patient is lethargic, but arousable. She is oriented to person and place only  Skin: Skin is warm and dry. No rash noted. No erythema.  Psychiatric:  Unable to assess due to patient condition    LABORATORY PANEL:   CBC  Recent Labs Lab 12/22/16 1902  WBC 6.6  HGB 11.4*  HCT 33.6*  PLT 165   ------------------------------------------------------------------------------------------------------------------  Chemistries   Recent Labs Lab 12/22/16 1902  NA 141  K 3.3*  CL 103  CO2 29  GLUCOSE 117*  BUN 25*  CREATININE 0.76  CALCIUM 9.0  AST 43*  ALT 28  ALKPHOS 90  BILITOT 0.8   ------------------------------------------------------------------------------------------------------------------  Cardiac Enzymes No results for input(s): TROPONINI in the last 168 hours. ------------------------------------------------------------------------------------------------------------------  RADIOLOGY:  Ct Head Wo Contrast  Result Date:  12/22/2016 CLINICAL DATA:  Altered mental status EXAM: CT HEAD WITHOUT CONTRAST TECHNIQUE: Contiguous axial images were obtained from the base of the skull through the vertex without intravenous contrast. COMPARISON:  10/19/2014 FINDINGS: Brain: No acute territorial infarction or intracranial hemorrhage is visualized. No focal mass, mass effect or midline shift. Moderate periventricular and subcortical white matter hypodensity consistent with small vessel disease. Hypodensity within the left cerebellum also unchanged. Ventricles are stable in size. Moderate atrophy. Vascular: No hyperdense vessels.  Carotid artery calcifications. Skull: No fracture.  Mastoid air cells clear. Sinuses/Orbits: Opacifications of the right sphenoid sinus with calcific deposits present. Mild thickening within the ethmoid sinuses. No acute orbital abnormality. Other: None IMPRESSION: 1. No definite CT evidence for acute intracranial abnormality 2. Moderate periventricular and subcortical white matter small vessel disease Electronically Signed   By: Donavan Foil M.D.   On: 12/22/2016 20:57   Dg Chest Portable 1 View  Result Date: 12/22/2016 CLINICAL DATA:  Acute onset of fever and lethargy. Initial encounter. EXAM: PORTABLE CHEST 1 VIEW COMPARISON:  Chest radiograph performed 10/19/2014 FINDINGS: The lungs are well-aerated. Mild vascular congestion is noted. There is mild elevation of the left hemidiaphragm. Mild right basilar airspace opacity may reflect pneumonia. There is no evidence of pleural effusion or pneumothorax. The cardiomediastinal silhouette is borderline enlarged. No acute osseous abnormalities are seen. Degenerative change is noted at the glenohumeral joints bilaterally. IMPRESSION: Mild vascular congestion and borderline cardiomegaly. Mild elevation of the left hemidiaphragm. Mild right basilar airspace opacity may reflect pneumonia. Electronically Signed   By: Garald Balding M.D.   On: 12/22/2016 20:25    EKG:    Orders placed or performed during the hospital encounter of 12/22/16  . ED EKG 12-Lead  . ED EKG 12-Lead  . EKG 12-Lead  . EKG 12-Lead    IMPRESSION AND PLAN:  Principal Problem:   Sepsis ([place]) - IV antibiotics given in the ED and continued on admission, cultures sent for admission, blood pressure stable Active Problems:   CAP (community acquired pneumonia) - IV antibiotics and cultures as above, when necessary duo nebs and antitussives   UTI (urinary tract infection) - IV antibiotics as above, culture sent   HTN (hypertension) - stable, continue home meds   Atrial fibrillation ([place]) - continue home rate controlling medication   Dementia - continue home meds  All the records are reviewed and case discussed with ED provider. Management plans discussed with the patient and/or family.  DVT PROPHYLAXIS: SubQ lovenox  GI PROPHYLAXIS: None  ADMISSION STATUS: Inpatient  CODE STATUS: DO NOT RESUSCITATE Code Status History    This patient does not have a recorded code status. Please follow your organizational policy for patients in this situation.      TOTAL TIME TAKING CARE OF THIS PATIENT: 45 minutes.    Rai Sinagra FIELDING 12/22/2016, 9:47 PM  [facility]  Office  [phone]  CC: Primary care physician; Rusty Aus, MD

## 2016-12-23 DIAGNOSIS — L8990 Pressure ulcer of unspecified site, unspecified stage: Secondary | ICD-10-CM | POA: Insufficient documentation

## 2016-12-23 LAB — BLOOD CULTURE ID PANEL (REFLEXED)
Acinetobacter baumannii: NOT DETECTED
CANDIDA GLABRATA: NOT DETECTED
CANDIDA KRUSEI: NOT DETECTED
CANDIDA PARAPSILOSIS: NOT DETECTED
CARBAPENEM RESISTANCE: NOT DETECTED
Candida albicans: NOT DETECTED
Candida tropicalis: NOT DETECTED
ESCHERICHIA COLI: DETECTED — AB
Enterobacter cloacae complex: NOT DETECTED
Enterobacteriaceae species: DETECTED — AB
Enterococcus species: NOT DETECTED
Haemophilus influenzae: NOT DETECTED
KLEBSIELLA OXYTOCA: NOT DETECTED
Klebsiella pneumoniae: NOT DETECTED
Listeria monocytogenes: NOT DETECTED
Neisseria meningitidis: NOT DETECTED
PSEUDOMONAS AERUGINOSA: NOT DETECTED
Proteus species: NOT DETECTED
SERRATIA MARCESCENS: NOT DETECTED
STAPHYLOCOCCUS AUREUS BCID: NOT DETECTED
STAPHYLOCOCCUS SPECIES: NOT DETECTED
STREPTOCOCCUS PYOGENES: NOT DETECTED
Streptococcus agalactiae: NOT DETECTED
Streptococcus pneumoniae: NOT DETECTED
Streptococcus species: NOT DETECTED

## 2016-12-23 LAB — CBC
HCT: 29.2 % — ABNORMAL LOW (ref 35.0–47.0)
Hemoglobin: 9.8 g/dL — ABNORMAL LOW (ref 12.0–16.0)
MCH: 29.2 pg (ref 26.0–34.0)
MCHC: 33.6 g/dL (ref 32.0–36.0)
MCV: 87 fL (ref 80.0–100.0)
PLATELETS: 150 10*3/uL (ref 150–440)
RBC: 3.36 MIL/uL — ABNORMAL LOW (ref 3.80–5.20)
RDW: 14.6 % — ABNORMAL HIGH (ref 11.5–14.5)
WBC: 25.6 10*3/uL — ABNORMAL HIGH (ref 3.6–11.0)

## 2016-12-23 LAB — BASIC METABOLIC PANEL
Anion gap: 6 (ref 5–15)
BUN: 24 mg/dL — AB (ref 6–20)
CHLORIDE: 108 mmol/L (ref 101–111)
CO2: 28 mmol/L (ref 22–32)
CREATININE: 0.92 mg/dL (ref 0.44–1.00)
Calcium: 8.6 mg/dL — ABNORMAL LOW (ref 8.9–10.3)
GFR calc Af Amer: 60 mL/min — ABNORMAL LOW (ref >60)
GFR calc non Af Amer: 52 mL/min — ABNORMAL LOW (ref >60)
GLUCOSE: 128 mg/dL — AB (ref 65–99)
Potassium: 3.3 mmol/L — ABNORMAL LOW (ref 3.5–5.1)
SODIUM: 142 mmol/L (ref 135–145)

## 2016-12-23 LAB — BLOOD GAS, VENOUS
Acid-Base Excess: 6.6 mmol/L — ABNORMAL HIGH (ref 0.0–2.0)
Bicarbonate: 30.5 mmol/L — ABNORMAL HIGH (ref 20.0–28.0)
O2 Saturation: 81 %
Patient temperature: 37
pCO2, Ven: 40 mmHg — ABNORMAL LOW (ref 44.0–60.0)
pH, Ven: 7.49 — ABNORMAL HIGH (ref 7.250–7.430)
pO2, Ven: 41 mmHg (ref 32.0–45.0)

## 2016-12-23 MED ORDER — SODIUM CHLORIDE 0.9 % IV SOLN
1.0000 g | Freq: Two times a day (BID) | INTRAVENOUS | Status: DC
  Filled 2016-12-23 (×2): qty 1

## 2016-12-23 MED ORDER — VANCOMYCIN HCL IN DEXTROSE 750-5 MG/150ML-% IV SOLN
750.0000 mg | INTRAVENOUS | Status: DC
  Administered 2016-12-23: 750 mg via INTRAVENOUS
  Filled 2016-12-23 (×2): qty 150

## 2016-12-23 MED ORDER — MEROPENEM-SODIUM CHLORIDE 1 GM/50ML IV SOLR
1.0000 g | Freq: Two times a day (BID) | INTRAVENOUS | Status: DC
  Administered 2016-12-23 – 2016-12-27 (×9): 1 g via INTRAVENOUS
  Filled 2016-12-23 (×10): qty 50

## 2016-12-23 MED ORDER — ORAL CARE MOUTH RINSE
15.0000 mL | Freq: Two times a day (BID) | OROMUCOSAL | Status: DC
  Administered 2016-12-23 – 2016-12-29 (×14): 15 mL via OROMUCOSAL

## 2016-12-23 MED ORDER — PIPERACILLIN-TAZOBACTAM 3.375 G IVPB
3.3750 g | Freq: Three times a day (TID) | INTRAVENOUS | Status: DC
  Administered 2016-12-23: 3.375 g via INTRAVENOUS
  Filled 2016-12-23: qty 50

## 2016-12-23 MED ORDER — POTASSIUM CHLORIDE CRYS ER 20 MEQ PO TBCR
30.0000 meq | EXTENDED_RELEASE_TABLET | Freq: Two times a day (BID) | ORAL | Status: AC
  Administered 2016-12-23 (×2): 30 meq via ORAL
  Filled 2016-12-23 (×2): qty 1

## 2016-12-23 NOTE — Progress Notes (Signed)
ANTIBIOTIC CONSULT NOTE - INITIAL  Pharmacy Consult for Vancomycin, Zosyn  Indication: sepsis  Allergies  Allergen Reactions  . Cholecalciferol Other (See Comments)  . Nitrofurantoin Macrocrystal Itching and Rash    Patient Measurements: Height: 5\' 5"  (165.1 cm) Weight: 140 lb (63.5 kg) IBW/kg (Calculated) : 57 Adjusted Body Weight: 59.6 kg   Vital Signs: Temp: 100.9 F (38.3 C) (01/11 2344) Temp Source: Rectal (01/11 2155) BP: 128/54 (01/11 2344) Pulse Rate: 92 (01/11 2344) Intake/Output from previous day: 01/11 0701 - 01/12 0700 In: 1199 [I.V.:999; IV Piggyback:200] Out: -  Intake/Output from this shift: Total I/O In: 1199 [I.V.:999; IV Piggyback:200] Out: -   Labs:  Recent Labs  12/22/16 1902  WBC 6.6  HGB 11.4*  PLT 165  CREATININE 0.76   Estimated Creatinine Clearance: 38.7 mL/min (by C-G formula based on SCr of 0.76 mg/dL). No results for input(s): VANCOTROUGH, VANCOPEAK, VANCORANDOM, GENTTROUGH, GENTPEAK, GENTRANDOM, TOBRATROUGH, TOBRAPEAK, TOBRARND, AMIKACINPEAK, AMIKACINTROU, AMIKACIN in the last 72 hours.   Microbiology: No results found for this or any previous visit (from the past 720 hour(s)).  Medical History: Past Medical History:  Diagnosis Date  . Arthritis   . Atrial fibrillation ([place])   . Dementia   . Heartburn   . HTN (hypertension)   . Recurrent UTI   . Sleep apnea   . Vaginal atrophy     Medications:  Prescriptions Prior to Admission  Medication Sig Dispense Refill Last Dose  . acetaminophen (TYLENOL) 500 MG tablet Take by mouth.   prn at prn  . calcium-vitamin D (CALCIUM 500/D) 500-200 MG-UNIT tablet Take 1 tablet by mouth 2 (two) times daily.    Unknown at Unknown  . cefUROXime (CEFTIN) 250 MG tablet Take 250 mg by mouth 2 (two) times daily with a meal. For 7 days   12/22/2016 at Unknown time  . conjugated estrogens (PREMARIN) vaginal cream Place vaginally daily. Apply 0.5mg  (pea-sized amount)  just inside the vaginal introitus  with a finger-tip every night for two weeks and then Monday, Wednesday and Friday nights.   12/21/2016 at Unknown time  . Cyanocobalamin (RA VITAMIN B-12 TR) 1000 MCG TBCR Take 1 tablet by mouth daily.    Unknown at Unknown  . D-MANNOSE PO Take 1 capsule by mouth 2 (two) times daily.   Unknown at Unknown  . donepezil (ARICEPT) 5 MG tablet Take 5 mg by mouth at bedtime.    12/21/2016 at 2100  . metoprolol succinate (TOPROL-XL) 50 MG 24 hr tablet Take 25 mg by mouth daily.    12/22/2016 at 2100  . Multiple Vitamins-Minerals (PRESERVISION AREDS PO) Take 1 capsule by mouth 2 (two) times daily.    12/22/2016 at Unknown time  . QUEtiapine (SEROQUEL) 25 MG tablet Take 25 mg by mouth at bedtime.    12/21/2016 at Unknown time  . sennosides-docusate sodium (SENOKOT-S) 8.6-50 MG tablet Take 2 tablets by mouth 2 (two) times daily.    Unknown at Unknown  . traMADol-acetaminophen (ULTRACET) 37.5-325 MG tablet Take 0.5-1 tablets by mouth every 6 (six) hours as needed.    12/22/2016 at 1600   Assessment: CrCl = 38.7 ml/min Ke = 0.042 hr-1 T1/2 = 16.5 hrs Vd = 44.5 L   Goal of Therapy:  Vancomycin trough level 15-20 mcg/ml  Plan:  Expected duration 7 days with resolution of temperature and/or normalization of WBC   Zosyn 3.375 gm IV X 1 given on 1/11 @ 20:00. Zosyn 3.375 gm IV Q8H EI ordered to start on 1/12 @  0200.  Vancomycin 1 gm IV X 1 given on 1/11 @ 20:00. Vancomycin 750 mg IV Q18H ordered to start on 1/12 @ 0500 , 9 hrs after 1st dose (stacked dosing). This pt will reach Css by 1/15 @ 0800. Will draw 1st trough on 1/15 @ 0430, which will be very close to Css.   Ferrell Claiborne D 12/23/2016,12:18 AM

## 2016-12-23 NOTE — Care Management (Signed)
Patient lives with daughter Denise Matthews. Patient and daughter go back and forth between patient's home in [place] and daughter's home in [place].  Patient with dementia and needs assist and supervison with all her adls.  She has become increasingly weak over the last several day and not able to ambulate.  has access to a walker.  02 requirements are acute.  If patient needs skilled nursing placement, facility preference would be [facility]. Requested order for PT and OT

## 2016-12-23 NOTE — Progress Notes (Signed)
[facility] [place] NAME: Toriah Liverpool    MR#:  [id number]  DATE OF BIRTH:  January 28, 1920  SUBJECTIVE: Admitted because of altered mental status, UTI. On meropenem. According to daughter she is not back to baseline yet.   CHIEF COMPLAINT:   Chief Complaint  Patient presents with  . Altered Mental Status    REVIEW OF SYSTEMS:   Review of Systems  Unable to perform ROS: Dementia     DRUG ALLERGIES:   Allergies  Allergen Reactions  . Cholecalciferol Other (See Comments)  . Nitrofurantoin Macrocrystal Itching and Rash    VITALS:  Blood pressure (!) 108/44, pulse 87, temperature 98 F (36.7 C), temperature source Oral, resp. rate 18, height 5\' 5"  (1.651 m), weight 63.5 kg (140 lb), SpO2 94 %.  PHYSICAL EXAMINATION:  GENERAL:  83 y.o.-year-old patient lying in the bed with no acute distress. Demented and hard of hearing. EYES: Pupils equal, round, reactive to light and accommodation. No scleral icterus. Extraocular muscles intact.  HEENT: Head atraumatic, normocephalic. Oropharynx and nasopharynx clear.  NECK:  Supple, no jugular venous distention. No thyroid enlargement, no tenderness.  LUNGS: Normal breath sounds bilaterally, no wheezing, rales,rhonchi or crepitation. No use of accessory muscles of respiration.  CARDIOVASCULAR: S1, S2 normal. No murmurs, rubs, or gallops.  ABDOMEN: Soft, nontender, nondistended. Bowel sounds present. No organomegaly or mass.  EXTREMITIES: No pedal edema, cyanosis, or clubbing.  NEUROLOGIC: unAble to do full neurological exam because of dementia.  PSYCHIATRIC: The patient is awake but demented.  SKIN: No obvious rash, lesion, or ulcer.    LABORATORY PANEL:   CBC  Recent Labs Lab 12/23/16 0503  WBC 25.6*  HGB 9.8*  HCT 29.2*  PLT 150   ------------------------------------------------------------------------------------------------------------------  Chemistries   Recent Labs Lab  12/22/16 1902 12/23/16 0503  NA 141 142  K 3.3* 3.3*  CL 103 108  CO2 29 28  GLUCOSE 117* 128*  BUN 25* 24*  CREATININE 0.76 0.92  CALCIUM 9.0 8.6*  AST 43*  --   ALT 28  --   ALKPHOS 90  --   BILITOT 0.8  --    ------------------------------------------------------------------------------------------------------------------  Cardiac Enzymes No results for input(s): TROPONINI in the last 168 hours. ------------------------------------------------------------------------------------------------------------------  RADIOLOGY:  Ct Head Wo Contrast  Result Date: 12/22/2016 CLINICAL DATA:  Altered mental status EXAM: CT HEAD WITHOUT CONTRAST TECHNIQUE: Contiguous axial images were obtained from the base of the skull through the vertex without intravenous contrast. COMPARISON:  10/19/2014 FINDINGS: Brain: No acute territorial infarction or intracranial hemorrhage is visualized. No focal mass, mass effect or midline shift. Moderate periventricular and subcortical white matter hypodensity consistent with small vessel disease. Hypodensity within the left cerebellum also unchanged. Ventricles are stable in size. Moderate atrophy. Vascular: No hyperdense vessels.  Carotid artery calcifications. Skull: No fracture.  Mastoid air cells clear. Sinuses/Orbits: Opacifications of the right sphenoid sinus with calcific deposits present. Mild thickening within the ethmoid sinuses. No acute orbital abnormality. Other: None IMPRESSION: 1. No definite CT evidence for acute intracranial abnormality 2. Moderate periventricular and subcortical white matter small vessel disease Electronically Signed   By: Donavan Foil M.D.   On: 12/22/2016 20:57   Dg Chest Portable 1 View  Result Date: 12/22/2016 CLINICAL DATA:  Acute onset of fever and lethargy. Initial encounter. EXAM: PORTABLE CHEST 1 VIEW COMPARISON:  Chest radiograph performed 10/19/2014 FINDINGS: The lungs are well-aerated. Mild vascular congestion is noted.  There is mild elevation of  the left hemidiaphragm. Mild right basilar airspace opacity may reflect pneumonia. There is no evidence of pleural effusion or pneumothorax. The cardiomediastinal silhouette is borderline enlarged. No acute osseous abnormalities are seen. Degenerative change is noted at the glenohumeral joints bilaterally. IMPRESSION: Mild vascular congestion and borderline cardiomegaly. Mild elevation of the left hemidiaphragm. Mild right basilar airspace opacity may reflect pneumonia. Electronically Signed   By: Garald Balding M.D.   On: 12/22/2016 20:25    EKG:   Orders placed or performed during the hospital encounter of 12/22/16  . EKG 12-Lead  . EKG 12-Lead    ASSESSMENT AND PLAN:   1 altered mental status and metabolic encephalopathy secondary to UTI, right base pneumonia. Continue meropenem. Patient has a history of recurrent UTIs. Follow urine cultures. #2/sepsis with gram-negative rods in the bed with Escherichia coli likely source urine. Continue IV antibiotics, repeat blood cultures tomorrow. #3 dementia #4 discussed CODE STATUS and patient is DO NOT RESUSCITATE from before.  D/w daughter   All the records are reviewed and case discussed with Care Management/Social Workerr. Management plans discussed with the patient, family and they are in agreement.  CODE STATUS: DNR  TOTAL TIME TAKING CARE OF THIS PATIENT: 35 minutes.   POSSIBLE D/C IN 2-3DAYS, DEPENDING ON CLINICAL CONDITION.   Epifanio Lesches M.D on 12/23/2016 at 1:14 PM  Between 7am to 6pm - Pager - [phone]  After 6pm go to [email or web address] - password [email or web address] [facility]  Office  [phone]  CC: Primary care physician; Rusty Aus, MD   Note: This dictation was prepared with Dragon dictation along with smaller phrase technology. Any transcriptional errors that result from this process are unintentional.

## 2016-12-23 NOTE — Progress Notes (Signed)
Pharmacy Antibiotic Note  Denise Matthews is a 83 y.o. female admitted on 12/22/2016 with bacteremia. BCID results called to pharmacy on 1/12, E.Coli detected. Pharmacy has been consulted for Meropenem  dosing.  Plan: Will start patient on meropenem 1gm IV every 12 hours based on current CrCl <18ml/min  Height: 5\' 5"  (165.1 cm) Weight: 140 lb (63.5 kg) IBW/kg (Calculated) : 57  Temp (24hrs), Avg:100.1 F (37.8 C), Min:97.9 F (36.6 C), Max:102.1 F (38.9 C)   Recent Labs Lab 12/22/16 1902 12/23/16 0503  WBC 6.6 25.6*  CREATININE 0.76 0.92  LATICACIDVEN 1.7  --     Estimated Creatinine Clearance: 33.6 mL/min (by C-G formula based on SCr of 0.92 mg/dL).    Allergies  Allergen Reactions  . Cholecalciferol Other (See Comments)  . Nitrofurantoin Macrocrystal Itching and Rash    Antimicrobials this admission: 1/11 Zosyn >> 1/12 1/11 Vancomycin  >> 1/12 1/12 Meropenem >>  Dose adjustments this admission:   Microbiology results: 1/11: BCID: E.Coli  1/11 BCx: GNR 1/11 UCx: sent  1/11 Sputum: sent  Thank you for allowing pharmacy to be a part of this patient's care.  Pernell Dupre, PharmD, BCPS Clinical Pharmacist 12/23/2016 9:11 AM

## 2016-12-23 NOTE — Progress Notes (Signed)
Bedside handoff from [employer]. Patient sleeping, no distress. Daughter at bedside. Bed alarm on. Will continue to monitor.

## 2016-12-23 NOTE — Care Management Important Message (Signed)
Important Message  Patient Details  Name: SAMAYA BYL MRN: [id number] Date of Birth: 22-Nov-1920   Medicare Important Message Given:  Yes Initial signed IM printed from Epic and given to patient. Patient with dementia- daughter signed   Katrina Stack, RN 12/23/2016, 3:30 PM

## 2016-12-24 LAB — CBC
HCT: 29.2 % — ABNORMAL LOW (ref 35.0–47.0)
Hemoglobin: 9.7 g/dL — ABNORMAL LOW (ref 12.0–16.0)
MCH: 28.5 pg (ref 26.0–34.0)
MCHC: 33.3 g/dL (ref 32.0–36.0)
MCV: 85.5 fL (ref 80.0–100.0)
Platelets: 143 10*3/uL — ABNORMAL LOW (ref 150–440)
RBC: 3.42 MIL/uL — AB (ref 3.80–5.20)
RDW: 14.7 % — ABNORMAL HIGH (ref 11.5–14.5)
WBC: 15.8 10*3/uL — ABNORMAL HIGH (ref 3.6–11.0)

## 2016-12-24 LAB — URINE CULTURE

## 2016-12-24 NOTE — Progress Notes (Signed)
[facility] [place] NAME: Denise Matthews    MR#:  [id number]  DATE OF BIRTH:  Jan 11, 1920  SUBJECTIVE: seen at bedside.  Physical therapy recommended skilled nursing.   CHIEF COMPLAINT:   Chief Complaint  Patient presents with  . Altered Mental Status    REVIEW OF SYSTEMS:   Review of Systems  Unable to perform ROS: Dementia     DRUG ALLERGIES:   Allergies  Allergen Reactions  . Cholecalciferol Other (See Comments)  . Nitrofurantoin Macrocrystal Itching and Rash    VITALS:  Blood pressure 131/66, pulse 85, temperature 98.3 F (36.8 C), temperature source Oral, resp. rate 18, height 5\' 5"  (1.651 m), weight 63.5 kg (140 lb), SpO2 96 %.  PHYSICAL EXAMINATION:  GENERAL:  83 y.o.-year-old patient lying in the bed with no acute distress. Demented and hard of hearing. EYES: Pupils equal, round, reactive to light and accommodation. No scleral icterus. Extraocular muscles intact.  HEENT: Head atraumatic, normocephalic. Oropharynx and nasopharynx clear.  NECK:  Supple, no jugular venous distention. No thyroid enlargement, no tenderness.  LUNGS: Normal breath sounds bilaterally, no wheezing, rales,rhonchi or crepitation. No use of accessory muscles of respiration.  CARDIOVASCULAR: S1, S2 normal. No murmurs, rubs, or gallops.  ABDOMEN: Soft, nontender, nondistended. Bowel sounds present. No organomegaly or mass.  EXTREMITIES: No pedal edema, cyanosis, or clubbing.  NEUROLOGIC: unAble to do full neurological exam because of dementia.  PSYCHIATRIC: The patient is awake but demented.  SKIN: No obvious rash, lesion, or ulcer.    LABORATORY PANEL:   CBC  Recent Labs Lab 12/24/16 0515  WBC 15.8*  HGB 9.7*  HCT 29.2*  PLT 143*   ------------------------------------------------------------------------------------------------------------------  Chemistries   Recent Labs Lab 12/22/16 1902 12/23/16 0503  NA 141 142  K 3.3*  3.3*  CL 103 108  CO2 29 28  GLUCOSE 117* 128*  BUN 25* 24*  CREATININE 0.76 0.92  CALCIUM 9.0 8.6*  AST 43*  --   ALT 28  --   ALKPHOS 90  --   BILITOT 0.8  --    ------------------------------------------------------------------------------------------------------------------  Cardiac Enzymes No results for input(s): TROPONINI in the last 168 hours. ------------------------------------------------------------------------------------------------------------------  RADIOLOGY:  Ct Head Wo Contrast  Result Date: 12/22/2016 CLINICAL DATA:  Altered mental status EXAM: CT HEAD WITHOUT CONTRAST TECHNIQUE: Contiguous axial images were obtained from the base of the skull through the vertex without intravenous contrast. COMPARISON:  10/19/2014 FINDINGS: Brain: No acute territorial infarction or intracranial hemorrhage is visualized. No focal mass, mass effect or midline shift. Moderate periventricular and subcortical white matter hypodensity consistent with small vessel disease. Hypodensity within the left cerebellum also unchanged. Ventricles are stable in size. Moderate atrophy. Vascular: No hyperdense vessels.  Carotid artery calcifications. Skull: No fracture.  Mastoid air cells clear. Sinuses/Orbits: Opacifications of the right sphenoid sinus with calcific deposits present. Mild thickening within the ethmoid sinuses. No acute orbital abnormality. Other: None IMPRESSION: 1. No definite CT evidence for acute intracranial abnormality 2. Moderate periventricular and subcortical white matter small vessel disease Electronically Signed   By: Donavan Foil M.D.   On: 12/22/2016 20:57   Dg Chest Portable 1 View  Result Date: 12/22/2016 CLINICAL DATA:  Acute onset of fever and lethargy. Initial encounter. EXAM: PORTABLE CHEST 1 VIEW COMPARISON:  Chest radiograph performed 10/19/2014 FINDINGS: The lungs are well-aerated. Mild vascular congestion is noted. There is mild elevation of the left hemidiaphragm.  Mild right basilar airspace opacity may reflect pneumonia.  There is no evidence of pleural effusion or pneumothorax. The cardiomediastinal silhouette is borderline enlarged. No acute osseous abnormalities are seen. Degenerative change is noted at the glenohumeral joints bilaterally. IMPRESSION: Mild vascular congestion and borderline cardiomegaly. Mild elevation of the left hemidiaphragm. Mild right basilar airspace opacity may reflect pneumonia. Electronically Signed   By: Garald Balding M.D.   On: 12/22/2016 20:25    EKG:   Orders placed or performed during the hospital encounter of 12/22/16  . EKG 12-Lead  . EKG 12-Lead    ASSESSMENT AND PLAN:   1 altered mental status and metabolic encephalopathy secondary to UTI, right base pneumonia. Continue meropenem. Patient has a history of recurrent UTIs. Follow urine cultures.WBC coming down.   #2/sepsis with gram-negative rods in the bed with Escherichia coli likely source urine. Continue IV antibiotics, repeat blood cultures   . #3 dementia  #4 discussed CODE STATUS with daughter ,she  is DO NOT RESUSCITATE from before.   D/w daughter #5 physical therapy  Recommends  skilled nursing.  All the records are reviewed and case discussed with Care Management/Social Workerr. Management plans discussed with the patient, family and they are in agreement.  CODE STATUS: DNR  TOTAL TIME TAKING CARE OF THIS PATIENT: 35 minutes.   POSSIBLE D/C IN 2-3DAYS, DEPENDING ON CLINICAL CONDITION.   Epifanio Lesches M.D on 12/24/2016 at 12:37 PM  Between 7am to 6pm - Pager - [phone]  After 6pm go to [email or web address] - password [email or web address] [facility]  Office  [phone]  CC: Primary care physician; Rusty Aus, MD   Note: This dictation was prepared with Dragon dictation along with smaller phrase technology. Any transcriptional errors that result from this process are unintentional.

## 2016-12-24 NOTE — Plan of Care (Signed)
Problem: Safety: Goal: Ability to remain free from injury will improve Outcome: Progressing Bed alarms utilized, hourly rounding on patient, call button within reach and patient needs assessed hourly.

## 2016-12-24 NOTE — Evaluation (Signed)
Physical Therapy Evaluation Patient Details Name: Denise Matthews MRN: [id number] DOB: 1920/05/30 Today's Date: 12/24/2016   History of Present Illness  presented to ER secondary to AMS; admitted with sepsis and metabolic encephalopathy related to CAP, UTI.  Clinical Impression  Upon evaluation, patient alert and oriented to self only; follows simple, one-step commands but demonstrates very limited insight/awareness of situation and need for assist with mobility. Bilat UE/LE strength and ROM grossly at least 3/5; question some degree of functional weakness, inattention to L.  Difficult to fully assess due to cognition.  Requiring mod/max assist for bed mobility, min/mod assist for unsupported sitting balance (due to L posterior/lateral trunk lean), mod/max assist for sit/stand, standing balance and bed/chair transfer with RW.  Very unsteady and unsafe; requiring extensive hands-on assist at all times.  Very high risk for falls. Would benefit from skilled PT to address above deficits and promote optimal return to PLOF; recommend transition to STR upon discharge from acute hospitalization.     Follow Up Recommendations SNF    Equipment Recommendations       Recommendations for Other Services       Precautions / Restrictions Precautions Precautions: Fall Restrictions Weight Bearing Restrictions: No      Mobility  Bed Mobility Overal bed mobility: Needs Assistance Bed Mobility: Supine to Sit     Supine to sit: Mod assist;Max assist        Transfers Overall transfer level: Needs assistance Equipment used: Rolling walker (2 wheeled) Transfers: Sit to/from Stand Sit to Stand: Max assist            Ambulation/Gait Ambulation/Gait assistance: Mod assist;Max assist Ambulation Distance (Feet): 5 Feet Assistive device: Rolling walker (2 wheeled)       General Gait Details: forward flexed posture; short, shuffling steps; poor/absent balance reactions. Consistent L  posterior/lateral weight shift requiring mod/max assist from therapist to prevent fall at all times.  Stairs            Wheelchair Mobility    Modified Rankin (Stroke Patients Only)       Balance Overall balance assessment: Needs assistance Sitting-balance support: No upper extremity supported;Feet supported Sitting balance-Leahy Scale: Poor   Postural control: Posterior lean;Left lateral lean Standing balance support: Bilateral upper extremity supported Standing balance-Leahy Scale: Zero                               Pertinent Vitals/Pain Pain Assessment: No/denies pain    Home Living Family/patient expects to be discharged to:: Private residence Living Arrangements: Children Available Help at Discharge: Family             Additional Comments: Per chart, patient goes back and forth between daughters' homes, requires assist with all ADLs and mobility at baseline.  Patient unable to provide information.    Prior Function           Comments: Patient unable to provide     Hand Dominance        Extremity/Trunk Assessment   Upper Extremity Assessment Upper Extremity Assessment: Generalized weakness    Lower Extremity Assessment Lower Extremity Assessment: Generalized weakness (grossly at least 3/5 throughout; question some degree of L -sided weakness, inattention.  difficulty to fully assess due to impaired cognition)       Communication   Communication: HOH  Cognition Arousal/Alertness: Awake/alert Behavior During Therapy: WFL for tasks assessed/performed Overall Cognitive Status: Within Functional Limits for tasks assessed  General Comments      Exercises Other Exercises Other Exercises: Unsupported sitting, worked on R anterior/lateral weight shift (with pillows to R lateral aspect of patient) to promote accommodation to midline in A/P and M/L plane.  Patient able to correct with cuing/assist from  therapist; unable to maintain. Other Exercises: Sit/stand and static standing balance with RW, mod/max assist for management of incontinent bowel/bladder (dep assist of second person for hygiene).  Progressive forward trunk flexion with fatigue.   Assessment/Plan    PT Assessment Patient needs continued PT services  PT Problem List Decreased strength;Decreased range of motion;Decreased activity tolerance;Decreased balance;Decreased mobility;Decreased coordination;Decreased cognition;Decreased knowledge of use of DME;Decreased safety awareness;Decreased knowledge of precautions          PT Treatment Interventions DME instruction;Gait training;Stair training;Functional mobility training;Therapeutic activities;Therapeutic exercise;Balance training;Patient/family education    PT Goals (Current goals can be found in the Care Plan section)  Acute Rehab PT Goals PT Goal Formulation: Patient unable to participate in goal setting Time For Goal Achievement: 01/07/17 Potential to Achieve Goals: Fair    Frequency Min 2X/week   Barriers to discharge Decreased caregiver support      Co-evaluation               End of Session Equipment Utilized During Treatment: Gait belt Activity Tolerance: Patient tolerated treatment well Patient left: in chair;with call bell/phone within reach;with chair alarm set Nurse Communication: Mobility status         Time: [id number] PT Time Calculation (min) (ACUTE ONLY): 24 min   Charges:   PT Evaluation $PT Eval Low Complexity: 1 Procedure PT Treatments $Therapeutic Activity: 8-22 mins   PT G Codes:        Sarim Rothman H. Owens Shark, PT, DPT, NCS 12/24/16, 10:58 AM [phone]

## 2016-12-24 NOTE — Evaluation (Signed)
Occupational Therapy Evaluation Patient Details Name: ZER GABOR MRN: [id number] DOB: [birth year] Today's Date: 12/24/2016    History of Present Illness presented to ER secondary to AMS; admitted with sepsis and metabolic encephalopathy related to CAP, UTI.   Clinical Impression   83yo female with dementia, presenting with weakness/impaired cognition (dementia at baseline, difficult to assess change from baseline)/activity tolerance with need for skilled OT services to address noted impairments and functional deficits in order to return to PLOF with adult daughter as caregiver providing extensive assist for ADLs and functional mobility. Caregiver educated in compensatory strategies to support ADL participation and functional mobility to support pt safety and function with verbal and visual demonstration. Caregiver very thankful for education and reports able to care for pt once she is at baseline. Recommend SNF for STR prior to return home with caregiver.     Follow Up Recommendations  SNF    Equipment Recommendations  Tub/shower seat    Recommendations for Other Services       Precautions / Restrictions Precautions Precautions: Fall Restrictions Weight Bearing Restrictions: No      Mobility Bed Mobility               General bed mobility comments: did not attempt due to pt fatigue/safety  Transfers                 General transfer comment: did not attempt due to pt fatigue/safety    Balance                                            ADL                                         General ADL Comments: Unable to accurately assess due to pt fatigue and cognition, however daughter reports extensive assist required at home for all ADLs which she manages well     Vision Vision Assessment?: Vision impaired- to be further tested in functional context   Perception     Praxis      Pertinent Vitals/Pain Pain Assessment:   (unable to report pain level, did not appear in pain)     Hand Dominance     Extremity/Trunk Assessment Upper Extremity Assessment Upper Extremity Assessment: Difficult to assess due to impaired cognition   Lower Extremity Assessment Lower Extremity Assessment: Defer to PT evaluation       Communication Communication Communication: Other (comment) (dementia and fatigue impeding ability to communicate)   Cognition Arousal/Alertness: Lethargic Behavior During Therapy: Flat affect Overall Cognitive Status: History of cognitive impairments - at baseline                 General Comments: pt with dementia, daughter reports pt has difficulty with sequencing of ADL tasks requiring assist and verbal cues   General Comments       Exercises       Shoulder Instructions      Home Living Family/patient expects to be discharged to:: Private residence Living Arrangements: Children Available Help at Discharge: Family;Available 24 hours/day Type of Home: House Home Access: Stairs to enter [employer] of Steps: 4-5 steps at pt's home, 3 steps at daughter's home (daughter and pt go back and forth between both homes) Entrance Stairs-Rails: Left (  L rail at pt's home, none at daughter's) Home Layout:  (pt's home is 2 levels but lives on main floor, daughter's is 1 level)     Bathroom Shower/Tub:  (pt's home has walkin shower with 1/2 step and glass door with grab bars (suction) and elevated toilets; daughter's home has tub shower w/ curtain plus a shower chair and grab bars (suction))   Bathroom Toilet: Handicapped height Bathroom Accessibility: Yes How Accessible: Accessible via walker Home Equipment: Other (comment);Shower seat;Adaptive equipment (pt uses rollator at baseline per daughter's report) Adaptive Equipment: Long-handled shoe horn Additional Comments: Per chart, patient goes back and forth between daughters' homes, requires assist with all ADLs and mobility at  baseline.  Patient unable to provide information.      Prior Functioning/Environment Level of Independence: Needs assistance        Comments: Patient unable to provide; spoke to daughter who reports pt requires assist for all ADL and functional mobility using rollator        OT Problem List: Decreased strength;Decreased safety awareness;Decreased activity tolerance   OT Treatment/Interventions: Self-care/ADL training;Therapeutic exercise;Therapeutic activities;Patient/family education;DME and/or AE instruction    OT Goals(Current goals can be found in the care plan section) Acute Rehab OT Goals Patient Stated Goal: to get stronger OT Goal Formulation: With family Time For Goal Achievement: 01/07/17 Potential to Achieve Goals: Good  OT Frequency: Min 1X/week   Barriers to D/C:            Co-evaluation              End of Session    Activity Tolerance: Patient limited by fatigue;Other (comment) (session in afternoon, pt likely experiencing sundowning) Patient left: in bed;with call bell/phone within reach;with bed alarm set;with family/visitor present   Time: [id number] OT Time Calculation (min): 40 min Charges:  OT General Charges $OT Visit: 1 Procedure OT Evaluation $OT Eval Low Complexity: 1 Procedure OT Treatments $Therapeutic Activity: 23-37 mins G-Codes:    Corky Sox, OTR/L 12/24/2016, 5:45 PM

## 2016-12-24 NOTE — Progress Notes (Signed)
Pt. Slept well throughout the night with frequent turning. Pt. continues to be incontinent. No signs or c/o pain, SOB or acute ditress noted. HR was NSR throughout the night running in 70's-80's, pt. Remains only alert to herself.

## 2016-12-25 LAB — CBC
HEMATOCRIT: 30.1 % — AB (ref 35.0–47.0)
HEMOGLOBIN: 10.2 g/dL — AB (ref 12.0–16.0)
MCH: 29.2 pg (ref 26.0–34.0)
MCHC: 33.9 g/dL (ref 32.0–36.0)
MCV: 86.2 fL (ref 80.0–100.0)
Platelets: 169 10*3/uL (ref 150–440)
RBC: 3.5 MIL/uL — ABNORMAL LOW (ref 3.80–5.20)
RDW: 14.6 % — AB (ref 11.5–14.5)
WBC: 11 10*3/uL (ref 3.6–11.0)

## 2016-12-25 NOTE — Progress Notes (Signed)
[facility] at [place] NAME: Denise Matthews    MR#:  [id number]  DATE OF BIRTH:  January 28, 1920  SUBJECTIVE: seen at bedside.  Now new changes.no fever.  CHIEF COMPLAINT:   Chief Complaint  Patient presents with  . Altered Mental Status    REVIEW OF SYSTEMS:   Review of Systems  Unable to perform ROS: Dementia     DRUG ALLERGIES:   Allergies  Allergen Reactions  . Cholecalciferol Other (See Comments)  . Nitrofurantoin Macrocrystal Itching and Rash    VITALS:  Blood pressure (!) 141/56, pulse 88, temperature 98.6 F (37 C), resp. rate 18, height 5\' 5"  (1.651 m), weight 63.5 kg (140 lb), SpO2 93 %.  PHYSICAL EXAMINATION:  GENERAL:  83 y.o.-year-old patient lying in the bed with no acute distress. Demented and hard of hearing. EYES: Pupils equal, round, reactive to light and accommodation. No scleral icterus. Extraocular muscles intact.  HEENT: Head atraumatic, normocephalic. Oropharynx and nasopharynx clear.  NECK:  Supple, no jugular venous distention. No thyroid enlargement, no tenderness.  LUNGS: Normal breath sounds bilaterally, no wheezing, rales,rhonchi or crepitation. No use of accessory muscles of respiration.  CARDIOVASCULAR: S1, S2 normal. No murmurs, rubs, or gallops.  ABDOMEN: Soft, nontender, nondistended. Bowel sounds present. No organomegaly or mass.  EXTREMITIES: No pedal edema, cyanosis, or clubbing.  NEUROLOGIC: unAble to do full neurological exam because of dementia.  PSYCHIATRIC: The patient is awake but demented.  SKIN: No obvious rash, lesion, or ulcer.    LABORATORY PANEL:   CBC  Recent Labs Lab 12/25/16 0606  WBC 11.0  HGB 10.2*  HCT 30.1*  PLT 169   ------------------------------------------------------------------------------------------------------------------  Chemistries   Recent Labs Lab 12/22/16 1902 12/23/16 0503  NA 141 142  K 3.3* 3.3*  CL 103 108  CO2 29 28  GLUCOSE 117*  128*  BUN 25* 24*  CREATININE 0.76 0.92  CALCIUM 9.0 8.6*  AST 43*  --   ALT 28  --   ALKPHOS 90  --   BILITOT 0.8  --    ------------------------------------------------------------------------------------------------------------------  Cardiac Enzymes No results for input(s): TROPONINI in the last 168 hours. ------------------------------------------------------------------------------------------------------------------  RADIOLOGY:  No results found.  EKG:   Orders placed or performed during the hospital encounter of 12/22/16  . EKG 12-Lead  . EKG 12-Lead    ASSESSMENT AND PLAN:   1 altered mental status and metabolic encephalopathy secondary to UTI, right base pneumonia. Continue meropenem. Patient has a history of recurrent UTIs. Follow urine cultures.WBC normalized. Urine cultures showing mixed species due to contamination.rpt urine and blood cultures today,  #2/sepsis with gram-negative rods in the bed with Escherichia coli likely source urine. Continue IV antibiotics, repeat blood cultures   . #3 dementia  #4 discussed CODE STATUS with daughter ,she  is DO NOT RESUSCITATE from before.   D/w daughter #5 physical therapy  Recommends  skilled nursing.  All the records are reviewed and case discussed with Care Management/Social Workerr. Management plans discussed with the patient, family and they are in agreement.  CODE STATUS: DNR  TOTAL TIME TAKING CARE OF THIS PATIENT: 35 minutes.   POSSIBLE D/C IN 2-3DAYS, DEPENDING ON CLINICAL CONDITION.   Epifanio Lesches M.D on 12/25/2016 at 12:37 PM  Between 7am to 6pm - Pager - [phone]  After 6pm go to [email or web address] - password [email or web address] [facility]  Office  [phone]  CC: Primary care physician; Rusty Aus, MD   Note: This  dictation was prepared with Dragon dictation along with smaller phrase technology. Any transcriptional errors that result from this process are  unintentional.

## 2016-12-25 NOTE — Progress Notes (Signed)
Pt. Turned q2H throughout the night. No signs or c/o pain, SOB or acute distress noted. Pt. Slept well throughout the night.

## 2016-12-25 NOTE — Progress Notes (Signed)
Rectal temp 103.4, tylenol suppository given. Heat turned down in room, pt. Given cool sponge bath, blankets removed.

## 2016-12-26 ENCOUNTER — Inpatient Hospital Stay: Payer: PPO

## 2016-12-26 LAB — CULTURE, BLOOD (ROUTINE X 2)

## 2016-12-26 LAB — INFLUENZA PANEL BY PCR (TYPE A & B)
INFLAPCR: NEGATIVE
Influenza B By PCR: NEGATIVE

## 2016-12-26 MED ORDER — SODIUM CHLORIDE 0.9 % IV SOLN
INTRAVENOUS | Status: DC
  Administered 2016-12-26 – 2016-12-27 (×3): via INTRAVENOUS

## 2016-12-26 MED ORDER — SODIUM CHLORIDE 0.9 % IV BOLUS (SEPSIS)
250.0000 mL | Freq: Once | INTRAVENOUS | Status: AC
  Administered 2016-12-26: 250 mL via INTRAVENOUS

## 2016-12-26 NOTE — Progress Notes (Signed)
Pt. Slept throughout the night with temp at beginning of shift. Q2H turn throughout the night with heels floated. IV fluids continue. Mouth care performed throughout the night. Running ST at beginning of the shift R/T fever and NSR the rest of the night with HR flucutating between 70's to 90's.

## 2016-12-26 NOTE — Progress Notes (Signed)
[facility] [place] NAME: Ketty Spizzirri    MR#:  [id number]  DATE OF BIRTH:  07-09-1920  SUBJECTIVE: Patient had a high fever. Yesterday evening. Chest xray is negative for pneumonia.repeat blood cultures were negative.   CHIEF COMPLAINT:   Chief Complaint  Patient presents with  . Altered Mental Status    REVIEW OF SYSTEMS:   Review of Systems  Unable to perform ROS: Dementia     DRUG ALLERGIES:   Allergies  Allergen Reactions  . Cholecalciferol Other (See Comments)  . Nitrofurantoin Macrocrystal Itching and Rash    VITALS:  Blood pressure (!) 119/42, pulse 84, temperature 98.8 F (37.1 C), temperature source Oral, resp. rate 18, height 5\' 5"  (1.651 m), weight 63.5 kg (140 lb), SpO2 99 %.  PHYSICAL EXAMINATION:  GENERAL:  83 y.o.-year-old patient lying in the bed with no acute distress. Demented and hard of hearing. EYES: Pupils equal, round, reactive to light and accommodation. No scleral icterus. Extraocular muscles intact.  HEENT: Head atraumatic, normocephalic. Oropharynx and nasopharynx clear.  NECK:  Supple, no jugular venous distention. No thyroid enlargement, no tenderness.  LUNGS: Normal breath sounds bilaterally, no wheezing, rales,rhonchi or crepitation. No use of accessory muscles of respiration.  CARDIOVASCULAR: S1, S2 normal. No murmurs, rubs, or gallops.  ABDOMEN: Soft, nontender, nondistended. Bowel sounds present. No organomegaly or mass.  EXTREMITIES: No pedal edema, cyanosis, or clubbing.  NEUROLOGIC: unAble to do full neurological exam because of dementia.  PSYCHIATRIC: The patient is awake but demented.  SKIN: No obvious rash, lesion, or ulcer.    LABORATORY PANEL:   CBC  Recent Labs Lab 12/25/16 0606  WBC 11.0  HGB 10.2*  HCT 30.1*  PLT 169   ------------------------------------------------------------------------------------------------------------------  Chemistries   Recent  Labs Lab 12/22/16 1902 12/23/16 0503  NA 141 142  K 3.3* 3.3*  CL 103 108  CO2 29 28  GLUCOSE 117* 128*  BUN 25* 24*  CREATININE 0.76 0.92  CALCIUM 9.0 8.6*  AST 43*  --   ALT 28  --   ALKPHOS 90  --   BILITOT 0.8  --    ------------------------------------------------------------------------------------------------------------------  Cardiac Enzymes No results for input(s): TROPONINI in the last 168 hours. ------------------------------------------------------------------------------------------------------------------  RADIOLOGY:  Dg Chest 1 View  Result Date: 12/26/2016 CLINICAL DATA:  Fever EXAM: CHEST 1 VIEW COMPARISON:  12/22/2016 FINDINGS: Mild cardiac enlargement. Aortic atherosclerosis noted. Chronic interstitial coarsening and diffuse bronchial wall thickening noted. No airspace opacities. IMPRESSION: 1. Chronic bronchitic changes noted. Electronically Signed   By: Kerby Moors M.D.   On: 12/26/2016 10:26    EKG:   Orders placed or performed during the hospital encounter of 12/22/16  . EKG 12-Lead  . EKG 12-Lead    ASSESSMENT AND PLAN:   1 altered mental status and metabolic encephalopathy secondary to UTI, right base pneumonia. Continue meropenem. Patient has a history of recurrent UTIs. Follow urine cultures.WBC normalized. Urine cultures showing mixed species due to contamination.rpt urine and blood cultures  Repeat blood cultures are negative from 1/14 so far. Patient had a high fever up to 103 Fahrenheit last night, check influenza titers., If patient is afebrile for 24 hours likely discharge, patient's daughter prefers [facility].  #2/sepsis with gram-negative rods in the bed with Escherichia coli likely source urine. Continue IV meropenem. . #3 dementia  #4 discussed CODE STATUS with daughter ,she  is DO NOT RESUSCITATE from before.   D/w daughter #5 physical therapy  Recommends  skilled nursing.familty wants [facility].  All the records are reviewed  and case discussed with Care Management/Social Workerr. Management plans discussed with the patient, family and they are in agreement.  CODE STATUS: DNR  TOTAL TIME TAKING CARE OF THIS PATIENT: 35 minutes.   POSSIBLE D/C IN 2-3DAYS, DEPENDING ON CLINICAL CONDITION.   Epifanio Lesches M.D on 12/26/2016 at 4:07 PM  Between 7am to 6pm - Pager - [phone]  After 6pm go to [email or web address] - password [email or web address] [facility]  Office  [phone]  CC: Primary care physician; Rusty Aus, MD   Note: This dictation was prepared with Dragon dictation along with smaller phrase technology. Any transcriptional errors that result from this process are unintentional.

## 2016-12-26 NOTE — Progress Notes (Signed)
Dr. Marcille Blanco notified of pt's temp and request for IV fluids. New orders placed.

## 2016-12-26 NOTE — Care Management Important Message (Signed)
Important Message  Patient Details  Name: Denise Matthews MRN: [id number] Date of Birth: Jan 26, 1920   Medicare Important Message Given:  Yes    Katrina Stack, RN 12/26/2016, 3:18 PM

## 2016-12-26 NOTE — Progress Notes (Signed)
Pt. Temperature improving along with vital signs, skin is cooler to touch. Pt verbally fussing during turning. Will continue to monitor.

## 2016-12-27 LAB — BASIC METABOLIC PANEL
Anion gap: 5 (ref 5–15)
BUN: 15 mg/dL (ref 6–20)
CO2: 27 mmol/L (ref 22–32)
CREATININE: 0.62 mg/dL (ref 0.44–1.00)
Calcium: 8.1 mg/dL — ABNORMAL LOW (ref 8.9–10.3)
Chloride: 111 mmol/L (ref 101–111)
GFR calc Af Amer: >60 mL/min (ref >60)
GFR calc non Af Amer: >60 mL/min (ref >60)
Glucose, Bld: 96 mg/dL (ref 65–99)
Potassium: 3.5 mmol/L (ref 3.5–5.1)
Sodium: 143 mmol/L (ref 135–145)

## 2016-12-27 MED ORDER — CIPROFLOXACIN IN D5W 400 MG/200ML IV SOLN
400.0000 mg | Freq: Two times a day (BID) | INTRAVENOUS | Status: DC
  Administered 2016-12-27 – 2016-12-30 (×6): 400 mg via INTRAVENOUS
  Filled 2016-12-27 (×8): qty 200

## 2016-12-27 NOTE — Clinical Social Work Note (Signed)
Clinical Social Work Assessment  Patient Details  Name: Denise Matthews MRN: [id number] Date of Birth: 1920/10/10  Date of referral:  12/27/16               Reason for consult:  Facility Placement                Permission sought to share information with:  Family Supports, Customer service manager Permission granted to share information::     Name::     Karleena, Casey [phone]   Agency::  SNF admissions  Relationship::     Contact Information:     Housing/Transportation Living arrangements for the past 2 months:  Single Family Home Source of Information:  Adult Children Patient Interpreter Needed:  None Criminal Activity/Legal Involvement Pertinent to Current Situation/Hospitalization:  No - Comment as needed Significant Relationships:  Adult Children Lives with:  Adult Children Do you feel safe going back to the place where you live?  No Need for family participation in patient care:  Yes (Comment)  Care giving concerns: Patient's daughter would like her to go to SNF for short term rehab before returning back home.   Social Worker assessment / plan:  Patient is a 83 year old female who lives with her daughter.  Patient is alert and oriented x1, patient's daughter is HCPOA.  Patient was sleeping and has dementia, CSW completed assessment by speaking with patient's daughter.  Patient's daughter states she has been in rehab in the past and was at [facility], she would like her to return back to SNF for rehab.  CSW explained to patient's daughter that if [facility] does not have a bed available or can not take patient, she will have to consider alternate options.  Patient's daughter expressed understanding, and gave CSW permission to begin bed search process.  Patient's daughter was explained how insurance will pay for the stay and what to expect at SNF for rehab.  Patient's daughter did not have any other quesitions.  Employment status:  Retired Office manager PT Recommendations:  [place] / Referral to community resources:  [place]  Patient/Family's Response to care:  Patient's daughter in agreement to having patient go to SNF for short term rehab.  Patient/Family's Understanding of and Emotional Response to Diagnosis, Current Treatment, and Prognosis:  Patient's daughter expressed understanding of patient's diagnosis and aware of current treatment plan.  Emotional Assessment Appearance:  Appears stated age Attitude/Demeanor/Rapport:    Affect (typically observed):  Appropriate, Calm Orientation:  Oriented to Self Alcohol / Substance use:  Not Applicable Psych involvement (Current and /or in the community):  No (Comment)  Discharge Needs  Concerns to be addressed:  Lack of Support Readmission within the last 30 days:  No Current discharge risk:  Lack of support system Barriers to Discharge:  Insurance Authorization   Anell Barr 12/27/2016, 6:30 PM

## 2016-12-27 NOTE — Care Management (Signed)
Barrier- temp- as high as 100.6 within last 24 hours.

## 2016-12-27 NOTE — Progress Notes (Signed)
Physical Therapy Treatment Patient Details Name: Denise Matthews MRN: [id number] DOB: October 19, 1920 Today's Date: 12/27/2016    History of Present Illness presented to ER secondary to AMS; admitted with sepsis and metabolic encephalopathy related to CAP, UTI.    PT Comments    Pt sleeping upon entering but daughter said to go ahead with PT treatment. Pt required max assist to move to sitting position and became more alert once sitting but still confused and displayed difficulties following verbal commands. Min to max assist required to maintain sitting balance and correct posterior and left lateral lean. Unable to participate in reaching activities due to fatigue and confusion. Pt able to participate in feeding from family member while sitting. Pt displayed decrease in activity from previous encounter due to increase in fatigue and altered mental state. Pt continues to have balance, strength and cognitive deficits that limit functional mobility. She will continue to benefit from skilled Pt, recommend she discharge to SNF.     Follow Up Recommendations  SNF     Equipment Recommendations       Recommendations for Other Services       Precautions / Restrictions Precautions Precautions: Fall Restrictions Weight Bearing Restrictions: No    Mobility  Bed Mobility Overal bed mobility: Needs Assistance Bed Mobility: Supine to Sit     Supine to sit: Max assist     General bed mobility comments: pt became more awake once sitting posture was obtained  Transfers                 General transfer comment: did not attempt due to pt fatigue/safety  Ambulation/Gait             General Gait Details: did not attempt due to fatigue and safety   Stairs            Wheelchair Mobility    Modified Rankin (Stroke Patients Only)       Balance Overall balance assessment: Needs assistance Sitting-balance support: Single extremity supported Sitting balance-Leahy Scale:  Poor Sitting balance - Comments: support under pillow, sitting balance assist varied from min to max assit Postural control: Posterior lean;Left lateral lean     Standing balance comment: did not attempt due to fatigue and safety                    Cognition Arousal/Alertness: Lethargic Behavior During Therapy: Flat affect Overall Cognitive Status: History of cognitive impairments - at baseline                 General Comments: pt with dementia, daughter reports pt has difficulty with sequencing of ADL tasks requiring assist and verbal cues    Exercises Other Exercises Other Exercises: Unsupported sitting, worked on R Anterior lateral weight shifts w/ pillows under lat R arm to promote sitting in neutral position. Required min to max assist, unable to perform reaching at this time, participated in functional activities (feeding)    General Comments        Pertinent Vitals/Pain Pain Location: Bilat feet and lower legs Pain Descriptors / Indicators: Aching Pain Intervention(s): Monitored during session    Home Living                      Prior Function            PT Goals (current goals can now be found in the care plan section) Acute Rehab PT Goals Patient Stated Goal: to get stronger PT Goal  Formulation: With family Time For Goal Achievement: 01/07/17 Potential to Achieve Goals: Fair Progress towards PT goals: Not progressing toward goals - comment (due to decreased mental status )    Frequency    Min 2X/week      PT Plan Current plan remains appropriate    Co-evaluation             End of Session   Activity Tolerance: Patient limited by lethargy;Patient limited by fatigue Patient left: in bed;with call bell/phone within reach;with family/visitor present;with bed alarm set     Time: 1500-1530 PT Time Calculation (min) (ACUTE ONLY): 30 min  Charges:                       G Codes:      Julius Boniface Jan 16, 2017, 3:48  PM

## 2016-12-27 NOTE — Progress Notes (Signed)
[facility] [place] NAME: Nylayah Hawa    MR#:  [id number]  DATE OF BIRTH:  1920/01/19  SUBJECTIVE: Patient had a high fever. Yesterday evening. Chest xray is negative for pneumonia.repeat blood cultures were negative.   CHIEF COMPLAINT:   Chief Complaint  Patient presents with  . Altered Mental Status   Tired. Occasional cough as per daughter. Poor appetite.  REVIEW OF SYSTEMS:   Review of Systems  Unable to perform ROS: Dementia    DRUG ALLERGIES:   Allergies  Allergen Reactions  . Cholecalciferol Other (See Comments)  . Nitrofurantoin Macrocrystal Itching and Rash    VITALS:  Blood pressure (!) 162/60, pulse 77, temperature (!) 100.4 F (38 C), temperature source Rectal, resp. rate 18, height 5\' 5"  (1.651 m), weight 63.5 kg (140 lb), SpO2 97 %.  PHYSICAL EXAMINATION:  GENERAL:  83 y.o.-year-old patient lying in the bed with no acute distress. Demented and hard of hearing. EYES: Pupils equal, round, reactive to light and accommodation. No scleral icterus. Extraocular muscles intact.  HEENT: Head atraumatic, normocephalic. Oropharynx and nasopharynx clear.  NECK:  Supple, no jugular venous distention. No thyroid enlargement, no tenderness.  LUNGS: Normal breath sounds bilaterally, no wheezing, rales,rhonchi or crepitation. No use of accessory muscles of respiration.  CARDIOVASCULAR: S1, S2 normal. No murmurs, rubs, or gallops.  ABDOMEN: Soft, nontender, nondistended. Bowel sounds present. No organomegaly or mass.  EXTREMITIES: No pedal edema, cyanosis, or clubbing.  NEUROLOGIC: unAble to do full neurological exam because of dementia.  PSYCHIATRIC: The patient is awake but Confused.   LABORATORY PANEL:   CBC  Recent Labs Lab 12/25/16 0606  WBC 11.0  HGB 10.2*  HCT 30.1*  PLT 169   ------------------------------------------------------------------------------------------------------------------  Chemistries    Recent Labs Lab 12/22/16 1902  12/27/16 0429  NA 141  < > 143  K 3.3*  < > 3.5  CL 103  < > 111  CO2 29  < > 27  GLUCOSE 117*  < > 96  BUN 25*  < > 15  CREATININE 0.76  < > 0.62  CALCIUM 9.0  < > 8.1*  AST 43*  --   --   ALT 28  --   --   ALKPHOS 90  --   --   BILITOT 0.8  --   --   < > = values in this interval not displayed. ------------------------------------------------------------------------------------------------------------------  Cardiac Enzymes No results for input(s): TROPONINI in the last 168 hours. ------------------------------------------------------------------------------------------------------------------  RADIOLOGY:  Dg Chest 1 View  Result Date: 12/26/2016 CLINICAL DATA:  Fever EXAM: CHEST 1 VIEW COMPARISON:  12/22/2016 FINDINGS: Mild cardiac enlargement. Aortic atherosclerosis noted. Chronic interstitial coarsening and diffuse bronchial wall thickening noted. No airspace opacities. IMPRESSION: 1. Chronic bronchitic changes noted. Electronically Signed   By: Kerby Moors M.D.   On: 12/26/2016 10:26    EKG:   Orders placed or performed during the hospital encounter of 12/22/16  . EKG 12-Lead  . EKG 12-Lead    ASSESSMENT AND PLAN:   *  Escherichia coli Bacteremia secondary to UTI. Acute encephalopathy is improving. Sepsis resolved Continues to have fever. Today it is 100.6. Fever curve is going down. We'll switch meropenem to IV ciprofloxacin. If continue to have fever tomorrow will get CT scan of the abdomen. . # dementia  SNF after discharge  All the records are reviewed and case discussed with Care Management/Social Workerr. Management plans discussed with the patient, family and they  are in agreement.  CODE STATUS: DNR  TOTAL TIME TAKING CARE OF THIS PATIENT: 35 minutes.   POSSIBLE D/C IN 2-3DAYS, DEPENDING ON CLINICAL CONDITION.   Hillary Bow R M.D on 12/27/2016 at 2:59 PM  Between 7am to 6pm - Pager - [phone]  After  6pm go to [email or web address] - password [email or web address] [facility]  Office  [phone]  CC: Primary care physician; Rusty Aus, MD   Note: This dictation was prepared with Dragon dictation along with smaller phrase technology. Any transcriptional errors that result from this process are unintentional.

## 2016-12-27 NOTE — NC FL2 (Signed)
[place] LEVEL OF CARE SCREENING TOOL     IDENTIFICATION  Patient Name: Denise Matthews Birthdate: 1920/05/16 Sex: female Admission Date (Current Location): 12/22/2016  [place] and [place] Number:  Engineering geologist and Address:  [facility], [address] [place], [place] [address]      Provider Number: [id number]  Attending Physician Name and Address:  Hillary Bow, MD  Relative Name and Phone Number:       Current Level of Care: Hospital Recommended Level of Care: [place] Prior Approval Number:    Date Approved/Denied:   PASRR Number: [id number]  Discharge Plan: SNF    Current Diagnoses: Patient Active Problem List   Diagnosis Date Noted  . Pressure injury of skin 12/23/2016  . Sepsis ([place]) 12/22/2016  . CAP (community acquired pneumonia) 12/22/2016  . HTN (hypertension) 12/22/2016  . Dementia 12/22/2016  . Atrial fibrillation ([place]) 12/22/2016  . UTI (urinary tract infection) 12/22/2016  . Recurrent UTI 04/03/2016  . Vaginal atrophy 04/03/2016    Orientation RESPIRATION BLADDER Height & Weight     Self  Normal Incontinent Weight: 140 lb (63.5 kg) Height:  5\' 5"  (165.1 cm)  BEHAVIORAL SYMPTOMS/MOOD NEUROLOGICAL BOWEL NUTRITION STATUS      Continent Diet (Cardiac diet)  AMBULATORY STATUS COMMUNICATION OF NEEDS Skin   Limited Assist Verbally PU Stage and Appropriate Care PU Stage 1 Dressing:  (Every 3 days)                     Personal Care Assistance Level of Assistance  Bathing, Feeding, Dressing Bathing Assistance: Limited assistance Feeding assistance: Limited assistance Dressing Assistance: Limited assistance     Functional Limitations Info  Sight, Hearing, Speech Sight Info: Adequate Hearing Info: Adequate Speech Info: Adequate    SPECIAL CARE FACTORS FREQUENCY  PT (By licensed PT), OT (By licensed OT)     PT Frequency: 5x a week OT Frequency: 5x a week             Contractures Contractures Info: Not present    Additional Factors Info  Allergies, Code Status Code Status Info: DNR Allergies Info: CHOLECALCIFEROL, NITROFURANTOIN MACROCRYSTAL           Current Medications (12/27/2016):  This is the current hospital active medication list Current Facility-Administered Medications  Medication Dose Route Frequency Provider Last Rate Last Dose  . acetaminophen (TYLENOL) tablet 650 mg  650 mg Oral Q6H PRN Lance Coon, MD       Or  . acetaminophen (TYLENOL) suppository 650 mg  650 mg Rectal Q6H PRN Lance Coon, MD   650 mg at 12/25/16 2044  . benzonatate (TESSALON) capsule 200 mg  200 mg Oral TID PRN Lance Coon, MD   200 mg at 12/25/16 1050  . ciprofloxacin (CIPRO) IVPB 400 mg  400 mg Intravenous Q12H Srikar Sudini, MD   400 mg at 12/27/16 1558  . donepezil (ARICEPT) tablet 5 mg  5 mg Oral QHS Lance Coon, MD   5 mg at 12/26/16 2300  . enoxaparin (LOVENOX) injection 40 mg  40 mg Subcutaneous Q24H Lance Coon, MD   40 mg at 12/26/16 2300  . ipratropium-albuterol (DUONEB) 0.5-2.5 (3) MG/3ML nebulizer solution 3 mL  3 mL Nebulization Q4H PRN Lance Coon, MD      . MEDLINE mouth rinse  15 mL Mouth Rinse BID Lance Coon, MD   15 mL at 12/27/16 1000  . metoprolol succinate (TOPROL-XL) 24 hr tablet 25 mg  25 mg Oral Daily Lance Coon, MD   25 mg at 12/27/16 1030  . ondansetron (ZOFRAN) tablet 4 mg  4 mg Oral Q6H PRN Lance Coon, MD       Or  . ondansetron [facility]) injection 4 mg  4 mg Intravenous Q6H PRN Lance Coon, MD      . QUEtiapine (SEROQUEL) tablet 25 mg  25 mg Oral QHS Lance Coon, MD   25 mg at 12/26/16 2300     Discharge Medications: Please see discharge summary for a list of discharge medications.  Relevant Imaging Results:  Relevant Lab Results:   Additional Information SSN [id number]  Ross Ludwig, [place]

## 2016-12-28 ENCOUNTER — Encounter
Admission: EM | Disposition: A | Discharge status: Skilled Nursing Facility | Payer: Self-pay | Source: Home / Self Care | Attending: Internal Medicine

## 2016-12-28 ENCOUNTER — Inpatient Hospital Stay: Payer: PPO

## 2016-12-28 ENCOUNTER — Inpatient Hospital Stay: Payer: PPO | Admitting: Certified Registered"

## 2016-12-28 ENCOUNTER — Encounter: Payer: Self-pay | Admitting: Radiology

## 2016-12-28 DIAGNOSIS — N201 Calculus of ureter: Secondary | ICD-10-CM

## 2016-12-28 DIAGNOSIS — R7881 Bacteremia: Secondary | ICD-10-CM

## 2016-12-28 DIAGNOSIS — N390 Urinary tract infection, site not specified: Secondary | ICD-10-CM

## 2016-12-28 DIAGNOSIS — B9629 Other Escherichia coli [E. coli] as the cause of diseases classified elsewhere: Secondary | ICD-10-CM

## 2016-12-28 HISTORY — PX: CYSTOSCOPY WITH STENT PLACEMENT: SHX5790

## 2016-12-28 SURGERY — CYSTOSCOPY, WITH STENT INSERTION
Anesthesia: General | Site: Ureter | Laterality: Left | Wound class: Clean Contaminated

## 2016-12-28 MED ORDER — ONDANSETRON HCL 4 MG/2ML IJ SOLN: 4.0000 mg | Freq: Once | INTRAMUSCULAR | Status: DC | PRN

## 2016-12-28 MED ORDER — BISACODYL 5 MG PO TBEC
10.0000 mg | DELAYED_RELEASE_TABLET | Freq: Every day | ORAL | Status: AC
  Administered 2016-12-28 – 2016-12-30 (×3): 10 mg via ORAL
  Filled 2016-12-28 (×3): qty 2

## 2016-12-28 MED ORDER — IOPAMIDOL (ISOVUE-300) INJECTION 61%
75.0000 mL | Freq: Once | INTRAVENOUS | Status: AC | PRN
  Administered 2016-12-28: 75 mL via INTRAVENOUS

## 2016-12-28 MED ORDER — ONDANSETRON HCL 4 MG/2ML IJ SOLN
INTRAMUSCULAR | Status: AC
Start: 2016-12-28 — End: 2016-12-28
  Filled 2016-12-28: qty 2

## 2016-12-28 MED ORDER — PROPOFOL 10 MG/ML IV BOLUS
INTRAVENOUS | Status: AC
  Filled 2016-12-28: qty 20

## 2016-12-28 MED ORDER — ROCURONIUM BROMIDE 100 MG/10ML IV SOLN
INTRAVENOUS | Status: DC | PRN
  Administered 2016-12-28: 20 mg via INTRAVENOUS

## 2016-12-28 MED ORDER — LACTULOSE 10 GM/15ML PO SOLN: 30.0000 g | Freq: Four times a day (QID) | ORAL | Status: DC

## 2016-12-28 MED ORDER — DEXAMETHASONE SODIUM PHOSPHATE 10 MG/ML IJ SOLN
INTRAMUSCULAR | Status: AC
  Filled 2016-12-28: qty 1

## 2016-12-28 MED ORDER — SENNOSIDES-DOCUSATE SODIUM 8.6-50 MG PO TABS
2.0000 | ORAL_TABLET | Freq: Two times a day (BID) | ORAL | Status: DC
  Administered 2016-12-28 – 2016-12-30 (×4): 2 via ORAL
  Filled 2016-12-28 (×4): qty 2

## 2016-12-28 MED ORDER — FENTANYL CITRATE (PF) 100 MCG/2ML IJ SOLN
INTRAMUSCULAR | Status: AC
  Filled 2016-12-28: qty 2

## 2016-12-28 MED ORDER — FENTANYL CITRATE (PF) 100 MCG/2ML IJ SOLN: 25.0000 ug | INTRAMUSCULAR | Status: DC | PRN

## 2016-12-28 MED ORDER — SUGAMMADEX SODIUM 200 MG/2ML IV SOLN
INTRAVENOUS | Status: DC | PRN
  Administered 2016-12-28: 127 mg via INTRAVENOUS

## 2016-12-28 MED ORDER — FENTANYL CITRATE (PF) 100 MCG/2ML IJ SOLN
INTRAMUSCULAR | Status: DC | PRN
  Administered 2016-12-28: 50 ug via INTRAVENOUS
  Administered 2016-12-28: 25 ug via INTRAVENOUS

## 2016-12-28 MED ORDER — PROPOFOL 10 MG/ML IV BOLUS
INTRAVENOUS | Status: DC | PRN
Start: 2016-12-28 — End: 2016-12-28
  Administered 2016-12-28: 100 mg via INTRAVENOUS

## 2016-12-28 MED ORDER — PHENYLEPHRINE 40 MCG/ML (10ML) SYRINGE FOR IV PUSH (FOR BLOOD PRESSURE SUPPORT)
PREFILLED_SYRINGE | INTRAVENOUS | Status: AC
  Filled 2016-12-28: qty 10

## 2016-12-28 MED ORDER — DEXAMETHASONE SODIUM PHOSPHATE 10 MG/ML IJ SOLN
INTRAMUSCULAR | Status: DC | PRN
  Administered 2016-12-28: 10 mg via INTRAVENOUS

## 2016-12-28 MED ORDER — LACTATED RINGERS IV SOLN
INTRAVENOUS | Status: DC | PRN
  Administered 2016-12-28: 16:00:00 via INTRAVENOUS

## 2016-12-28 MED ORDER — SUGAMMADEX SODIUM 200 MG/2ML IV SOLN
INTRAVENOUS | Status: AC
  Filled 2016-12-28: qty 2

## 2016-12-28 MED ORDER — PHENYLEPHRINE HCL 10 MG/ML IJ SOLN
INTRAMUSCULAR | Status: DC | PRN
  Administered 2016-12-28 (×3): 80 ug via INTRAVENOUS

## 2016-12-28 MED ORDER — ONDANSETRON HCL 4 MG/2ML IJ SOLN
INTRAMUSCULAR | Status: DC | PRN
  Administered 2016-12-28: 4 mg via INTRAVENOUS

## 2016-12-28 MED ORDER — IOPAMIDOL (ISOVUE-300) INJECTION 61%: 30.0000 mL | Freq: Once | INTRAVENOUS | Status: DC

## 2016-12-28 MED ORDER — LIDOCAINE 2% (20 MG/ML) 5 ML SYRINGE
INTRAMUSCULAR | Status: AC
  Filled 2016-12-28: qty 5

## 2016-12-28 MED ORDER — LIDOCAINE HCL (CARDIAC) 20 MG/ML IV SOLN
INTRAVENOUS | Status: DC | PRN
  Administered 2016-12-28: 50 mg via INTRAVENOUS

## 2016-12-28 MED ORDER — ROCURONIUM BROMIDE 50 MG/5ML IV SOSY
PREFILLED_SYRINGE | INTRAVENOUS | Status: AC
  Filled 2016-12-28: qty 5

## 2016-12-28 MED ORDER — IOTHALAMATE MEGLUMINE 43 % IV SOLN
INTRAVENOUS | Status: DC | PRN
  Administered 2016-12-28: 10 mL

## 2016-12-28 SURGICAL SUPPLY — 20 items
BAG DRAIN CYSTO-URO LG1000N (MISCELLANEOUS) ×3 IMPLANT
CATH URETL 5X70 OPEN END (CATHETERS) ×3 IMPLANT
CONRAY 43 FOR UROLOGY 50M (MISCELLANEOUS) ×3 IMPLANT
GLOVE BIO SURGEON STRL SZ 6.5 (GLOVE) ×2 IMPLANT
GLOVE BIO SURGEONS STRL SZ 6.5 (GLOVE) ×1
GOWN STRL REUS W/ TWL LRG LVL4 (GOWN DISPOSABLE) ×2 IMPLANT
GOWN STRL REUS W/TWL LRG LVL4 (GOWN DISPOSABLE) ×4
KIT RM TURNOVER CYSTO AR (KITS) ×3 IMPLANT
PACK CYSTO AR (MISCELLANEOUS) ×3 IMPLANT
SCRUB POVIDONE IODINE 4 OZ (MISCELLANEOUS) IMPLANT
SENSORWIRE 0.038 NOT ANGLED (WIRE) ×3
SET CYSTO W/LG BORE CLAMP LF (SET/KITS/TRAYS/PACK) ×3 IMPLANT
SOL .9 NS 3000ML IRR  AL (IV SOLUTION) ×2
SOL .9 NS 3000ML IRR UROMATIC (IV SOLUTION) ×1 IMPLANT
STENT URET 6FRX24 CONTOUR (STENTS) ×3 IMPLANT
STENT URET 6FRX26 CONTOUR (STENTS) IMPLANT
SURGILUBE 2OZ TUBE FLIPTOP (MISCELLANEOUS) ×3 IMPLANT
SYRINGE IRR TOOMEY STRL 70CC (SYRINGE) ×3 IMPLANT
WATER STERILE IRR 1000ML POUR (IV SOLUTION) ×3 IMPLANT
WIRE SENSOR 0.038 NOT ANGLED (WIRE) ×1 IMPLANT

## 2016-12-28 NOTE — Evaluation (Signed)
Clinical/Bedside Swallow Evaluation Patient Details  Name: Denise Matthews MRN: [id number] Date of Birth: [birth year]  Today's Date: 12/28/2016 Time: SLP Start Time (ACUTE ONLY): 0815 SLP Stop Time (ACUTE ONLY): 0915 SLP Time Calculation (min) (ACUTE ONLY): 60 min  Past Medical History:  Past Medical History:  Diagnosis Date  . Arthritis   . Atrial fibrillation ([place])   . Dementia   . Heartburn   . HTN (hypertension)   . Recurrent UTI   . Sleep apnea   . Vaginal atrophy    Past Surgical History:  Past Surgical History:  Procedure Laterality Date  . ABDOMINAL HYSTERECTOMY    . CATARACT EXTRACTION    . ROTATOR CUFF REPAIR    . vaginal sling     HPI:      Assessment / Plan / Recommendation Clinical Impression  Pt appears to present w/ increased risk for aspiration evidenced by overt s/s of aspiration w/ trials of thin liquids. Pt was given cup sip trials of thin liquid w/ overt coughing noted immediately w/ trials. Pt was able to cough and clear the wetness. When pt was given trials of Nectar liquids via cup and straw, no overt s/s of aspiration were noted. Pt consumed these trials and puree trials w/ no change in status during/post trials. Oral phase appeared wfl for bolus management and clearing of these trial consistencies, however, increased oral phase time for bolus management and oral clearing was noted w/ increased textured foods(eggs) w/ min residue remaining. Pt required feeding support; aspiration precautions. Recommend a Dysphagia level 1 w/ Nectar consistency liquids w/ aspiration precautions; Pills given WHOLE in Puree as tolerates. ST services will f/u w/ toleration of diet and trials to upgrade if appropriate; objective swallow assessment as indicated.     Aspiration Risk  Mild aspiration risk;Moderate aspiration risk    Diet Recommendation  Dysphagia level 1 w/ Nectar liquids; strict aspiration precautions; feeding support; nutritional supplements as  ordered.  Medication Administration: Whole meds with puree (give Crushed in Puree if necessary and able)    Other  Recommendations Recommended Consults:  (Dietician consult) Oral Care Recommendations: Oral care BID;Staff/trained caregiver to provide oral care Other Recommendations: Order thickener from pharmacy;Prohibited food (jello, ice cream, thin soups);Remove water pitcher;Have oral suction available   Follow up Recommendations Skilled Nursing facility      Frequency and Duration min 3x week  2 weeks       Prognosis Prognosis for Safe Diet Advancement: Fair (-Good) Barriers to Reach Goals: Cognitive deficits      Swallow Study   General Date of Onset: 12/22/16 Type of Study: Bedside Swallow Evaluation Previous Swallow Assessment: none indicated by pt Diet Prior to this Study: Regular;Thin liquids Temperature Spikes Noted: Yes (wbc has been elevated) Respiratory Status: Room air History of Recent Intubation: No Behavior/Cognition: Alert;Cooperative;Pleasant mood;Confused;Distractible;Requires cueing Oral Cavity Assessment: Within Functional Limits;Dry Oral Care Completed by SLP: Yes Oral Cavity - Dentition: Adequate natural dentition;Missing dentition (few) Vision: Functional for self-feeding Self-Feeding Abilities: Able to feed self;Needs assist;Needs set up;Total assist Patient Positioning: Upright in bed Baseline Vocal Quality: Normal Volitional Cough: Strong Volitional Swallow: Able to elicit    Oral/Motor/Sensory Function Overall Oral Motor/Sensory Function: Within functional limits   Ice Chips Ice chips: Within functional limits Presentation: Spoon (fed; 3 trials)   Thin Liquid Thin Liquid: Impaired Presentation: Cup;Self Fed (assisted; 3 trials) Oral Phase Impairments:  (wfl) Oral Phase Functional Implications:  (wfl) Pharyngeal  Phase Impairments: Suspected delayed Swallow;Multiple swallows;Cough - Immediate;Cough - Delayed (x2/3  trials) Other Comments: pt  endorsed feelings of "getting strangled sometimes w/ water"     Nectar Thick Nectar Thick Liquid: Within functional limits Presentation: Cup;Self Fed;Straw (assisted; ~3 ozs)   Honey Thick Honey Thick Liquid: Not tested   Puree Puree: Within functional limits Presentation: Spoon (fed; 9-10 trials)   Solid   GO   Solid: Impaired (soft solids) Presentation: Spoon (fed; 2 trials) Oral Phase Impairments: Reduced lingual movement/coordination;Impaired mastication Oral Phase Functional Implications: Oral residue (increased oral phase time) Pharyngeal Phase Impairments:  (none) Other Comments: pt appeared to better tolerate the puree consistency         Denise Kenner, MS, CCC-SLP [name] 12/28/2016,12:20 PM

## 2016-12-28 NOTE — Anesthesia Procedure Notes (Signed)
Procedure Name: Intubation Date/Time: 12/28/2016 4:36 PM Performed by: Jonna Clark Pre-anesthesia Checklist: Patient identified, Patient being monitored, Timeout performed, Emergency Drugs available and Suction available Patient Re-evaluated:Patient Re-evaluated prior to inductionOxygen Delivery Method: Circle system utilized Preoxygenation: Pre-oxygenation with 100% oxygen Intubation Type: IV induction Ventilation: Mask ventilation without difficulty Laryngoscope Size: Miller and 2 Grade View: Grade I Tube type: Oral Tube size: 7.0 mm Number of attempts: 1 Airway Equipment and Method: Stylet Placement Confirmation: ETT inserted through vocal cords under direct vision,  positive ETCO2 and breath sounds checked- equal and bilateral Secured at: 21 cm Tube secured with: Tape Dental Injury: Teeth and Oropharynx as per pre-operative assessment

## 2016-12-28 NOTE — Progress Notes (Signed)
Rectal temp 100.5, 650 mg tylenol suppository given.  Dr. Erlene Quan informed.

## 2016-12-28 NOTE — Consult Note (Signed)
Urology Consult  I have been asked to see the patient by Dr. Darvin Neighbours, for evaluation and management of left ureteral stone.  Chief Complaint: fevers  History of Present Illness: Denise Matthews is a 83 y.o. year old admitted to the medical service on 12/22/2016 with confusion and fevers. Urinalysis consistent with urinary tract infection and she is been on antibiotics initially in the form of vancomycin and Zosyn later changed to meropenem and most recently transitioned to IV Cipro.Marland Kitchen  Despite her fairly aggressive antibiotics, she continued to spike fevers as high as 102 yesterday evening. She has otherwise remained hemodynamically stable.  Urine culture grew mixed flora, blood cultures grew Escherichia coli with likely urinary source.  Given her failure to improve and continued fevers, she underwent a contrast CT abdomen and pelvis earlier today which showed a 11 mm obstructing left UPJ stone with very mild hydronephrosis along with other nonobstructing stones.  There is also subtle mild decrease enhancement and delayed excretion of the left kidney.  Patient is demented and unable to provide additional history. I did have a lengthy discussion today with her daughter, Denise Matthews.  She has a history of atrophic vaginitis, recurrent urinary tract infections with enterococcus and Escherichia coli within the past year. She has no personal history of kidney stones prior to this.  Patient last ate at 8 AM at the time of speech and swallow eval.  Past Medical History:  Diagnosis Date  . Arthritis   . Atrial fibrillation ([place])   . Dementia   . Heartburn   . HTN (hypertension)   . Recurrent UTI   . Sleep apnea   . Vaginal atrophy     Past Surgical History:  Procedure Laterality Date  . ABDOMINAL HYSTERECTOMY    . CATARACT EXTRACTION    . ROTATOR CUFF REPAIR    . vaginal sling      Home Medications:  Current Meds  Medication Sig  . acetaminophen (TYLENOL) 500 MG tablet Take by  mouth.  . calcium-vitamin D (CALCIUM 500/D) 500-200 MG-UNIT tablet Take 1 tablet by mouth 2 (two) times daily.   . cefUROXime (CEFTIN) 250 MG tablet Take 250 mg by mouth 2 (two) times daily with a meal. For 7 days  . conjugated estrogens (PREMARIN) vaginal cream Place vaginally daily. Apply 0.5mg  (pea-sized amount)  just inside the vaginal introitus with a finger-tip every night for two weeks and then Monday, Wednesday and Friday nights.  . Cyanocobalamin (RA VITAMIN B-12 TR) 1000 MCG TBCR Take 1 tablet by mouth daily.   . D-MANNOSE PO Take 1 capsule by mouth 2 (two) times daily.  Marland Kitchen donepezil (ARICEPT) 5 MG tablet Take 5 mg by mouth at bedtime.   . metoprolol succinate (TOPROL-XL) 50 MG 24 hr tablet Take 25 mg by mouth daily.   . Multiple Vitamins-Minerals (PRESERVISION AREDS PO) Take 1 capsule by mouth 2 (two) times daily.   . QUEtiapine (SEROQUEL) 25 MG tablet Take 25 mg by mouth at bedtime.   . sennosides-docusate sodium (SENOKOT-S) 8.6-50 MG tablet Take 2 tablets by mouth 2 (two) times daily.   . traMADol-acetaminophen (ULTRACET) 37.5-325 MG tablet Take 0.5-1 tablets by mouth every 6 (six) hours as needed.   . [DISCONTINUED] estradiol (ESTRACE VAGINAL) 0.1 MG/GM vaginal cream Apply 0.5mg  (pea-sized amount)  just inside the vaginal introitus with a finger-tip every night for two weeks and then Monday, Wednesday and Friday nights.    Allergies:  Allergies  Allergen Reactions  . Cholecalciferol  Other (See Comments)  . Nitrofurantoin Macrocrystal Itching and Rash    Family History  Problem Relation Age of Onset  . Heart disease Father   . Stroke Father   . Cancer Brother   . Kidney disease Neg Hx   . Bladder Cancer Neg Hx     Social History:  reports that she has never smoked. She has never used smokeless tobacco. She reports that she does not drink alcohol or use drugs.  ROS: Unable to perform review of systems, patient is demented and poor historian.  Physical Exam:  Vital  signs in last 24 hours: Temp:  [97.9 F (36.6 C)-102 F (38.9 C)] 98.4 F (36.9 C) (01/17 1117) Pulse Rate:  [75-84] 79 (01/17 1117) Resp:  [16-26] 18 (01/17 1117) BP: (134-161)/(51-74) 161/74 (01/17 1117) SpO2:  [94 %-100 %] 100 % (01/17 1117) Constitutional:  Alert and oriented, No acute distress HEENT: [place] AT, moist mucus membranes.  Trachea midline, no masses Cardiovascular: Regular rate and rhythm, no clubbing, cyanosis, or edema. Respiratory: Normal respiratory effort, lungs clear bilaterally GI: Abdomen is soft, nontender, nondistended, no abdominal masses GU: No CVA tenderness.  Wearing diaper. Skin: No rashes, bruises or suspicious lesions.  Warm to touch. Neurologic: Grossly intact, no focal deficits, moving all 4 extremities Psychiatric: Lying in bed, minimally conversant. Somewhat agitated on exam with manipulation.   Laboratory Data:  No results for input(s): WBC, HGB, HCT in the last 72 hours.  Recent Labs  12/27/16 0429  NA 143  K 3.5  CL 111  CO2 27  GLUCOSE 96  BUN 15  CREATININE 0.62  CALCIUM 8.1*   No results for input(s): LABPT, INR in the last 72 hours. No results for input(s): LABURIN in the last 72 hours. Results for orders placed or performed during the hospital encounter of 12/22/16  Blood Culture (routine x 2)     Status: Abnormal   Collection Time: 12/22/16  7:01 PM  Result Value Ref Range Status   Specimen Description BLOOD RIGHT FA  Final   Special Requests   Final    BOTTLES DRAWN AEROBIC AND ANAEROBIC AER 22ML ANA 22ML   Culture  Setup Time   Final    GRAM NEGATIVE RODS IN BOTH AEROBIC AND ANAEROBIC BOTTLES CRITICAL VALUE NOTED.  VALUE IS CONSISTENT WITH PREVIOUSLY REPORTED AND CALLED VALUE.    Culture (A)  Final    ESCHERICHIA COLI SUSCEPTIBILITIES PERFORMED ON PREVIOUS CULTURE WITHIN THE LAST 5 DAYS. Performed at [facility]    Report Status 12/26/2016 FINAL  Final  Urine culture     Status: Abnormal   Collection Time:  12/22/16  7:01 PM  Result Value Ref Range Status   Specimen Description URINE, RANDOM  Final   Special Requests NONE  Final   Culture MULTIPLE SPECIES PRESENT, SUGGEST RECOLLECTION (A)  Final   Report Status 12/24/2016 FINAL  Final  Blood Culture (routine x 2)     Status: Abnormal   Collection Time: 12/22/16  7:02 PM  Result Value Ref Range Status   Specimen Description BLOOD RIGHT FA  Final   Special Requests   Final    BOTTLES DRAWN AEROBIC AND ANAEROBIC AER 17ML ANA 18ML   Culture  Setup Time   Final    GRAM NEGATIVE RODS IN BOTH AEROBIC AND ANAEROBIC BOTTLES CRITICAL RESULT CALLED TO, READ BACK BY AND VERIFIED WITH: Tobie Lords @ [id number] 12/23/16 by [place] (A)  Final   Report  Status 12/26/2016 FINAL  Final   Organism ID, Bacteria ESCHERICHIA COLI  Final      Susceptibility   Escherichia coli - MIC*    AMPICILLIN >=32 RESISTANT Resistant     CEFAZOLIN >=64 RESISTANT Resistant     CEFEPIME <=1 SENSITIVE Sensitive     CEFTAZIDIME <=1 SENSITIVE Sensitive     CEFTRIAXONE <=1 SENSITIVE Sensitive     CIPROFLOXACIN <=0.25 SENSITIVE Sensitive     GENTAMICIN <=1 SENSITIVE Sensitive     IMIPENEM <=0.25 SENSITIVE Sensitive     TRIMETH/SULFA <=20 SENSITIVE Sensitive     AMPICILLIN/SULBACTAM >=32 RESISTANT Resistant     PIP/TAZO 64 INTERMEDIATE Intermediate     * ESCHERICHIA COLI  Blood Culture ID Panel (Reflexed)     Status: Abnormal   Collection Time: 12/22/16  7:02 PM  Result Value Ref Range Status   Enterococcus species NOT DETECTED NOT DETECTED Final   Listeria monocytogenes NOT DETECTED NOT DETECTED Final   Staphylococcus species NOT DETECTED NOT DETECTED Final   Staphylococcus aureus NOT DETECTED NOT DETECTED Final   Streptococcus species NOT DETECTED NOT DETECTED Final   Streptococcus agalactiae NOT DETECTED NOT DETECTED Final   Streptococcus pneumoniae NOT DETECTED NOT DETECTED Final   Streptococcus pyogenes NOT DETECTED NOT DETECTED Final    Acinetobacter baumannii NOT DETECTED NOT DETECTED Final   Enterobacteriaceae species DETECTED (A) NOT DETECTED Final    Comment: CRITICAL RESULT CALLED TO, READ BACK BY AND VERIFIED WITH: Tobie Lords @ [id number] 12/23/16 by [place]    Enterobacter cloacae complex NOT DETECTED NOT DETECTED Final   Escherichia coli DETECTED (A) NOT DETECTED Final    Comment: CRITICAL RESULT CALLED TO, READ BACK BY AND VERIFIED WITH: Tobie Lords @ [id number] 12/23/16 by [place]    Klebsiella oxytoca NOT DETECTED NOT DETECTED Final   Klebsiella pneumoniae NOT DETECTED NOT DETECTED Final   Proteus species NOT DETECTED NOT DETECTED Final   Serratia marcescens NOT DETECTED NOT DETECTED Final   Carbapenem resistance NOT DETECTED NOT DETECTED Final   Haemophilus influenzae NOT DETECTED NOT DETECTED Final   Neisseria meningitidis NOT DETECTED NOT DETECTED Final   Pseudomonas aeruginosa NOT DETECTED NOT DETECTED Final   Candida albicans NOT DETECTED NOT DETECTED Final   Candida glabrata NOT DETECTED NOT DETECTED Final   Candida krusei NOT DETECTED NOT DETECTED Final   Candida parapsilosis NOT DETECTED NOT DETECTED Final   Candida tropicalis NOT DETECTED NOT DETECTED Final  CULTURE, BLOOD (ROUTINE X 2) w Reflex to ID Panel     Status: None (Preliminary result)   Collection Time: 12/25/16  1:11 PM  Result Value Ref Range Status   Specimen Description BLOOD RIGHT ASSIST CONTROL  Final   Special Requests   Final    BOTTLES DRAWN AEROBIC AND ANAEROBIC AER11ML ANA11ML   Culture NO GROWTH 3 DAYS  Final   Report Status PENDING  Incomplete  CULTURE, BLOOD (ROUTINE X 2) w Reflex to ID Panel     Status: None (Preliminary result)   Collection Time: 12/25/16  1:54 PM  Result Value Ref Range Status   Specimen Description BLOOD LEFT HAND  Final   Special Requests BOTTLES DRAWN AEROBIC AND ANAEROBIC AER8ML ANA5ML  Final   Culture NO GROWTH 3 DAYS  Final   Report Status PENDING  Incomplete     Radiologic Imaging: Ct Abdomen Pelvis W  Contrast  Result Date: 12/28/2016 CLINICAL DATA:  Altered mental status, fever EXAM: CT ABDOMEN AND PELVIS WITH CONTRAST TECHNIQUE: Multidetector CT imaging of the  abdomen and pelvis was performed using the standard protocol following bolus administration of intravenous contrast. CONTRAST:  36mL ISOVUE-300 IOPAMIDOL (ISOVUE-300) INJECTION 61% COMPARISON:  CT scan 09/02/2010 FINDINGS: Lower chest: Cardiomegaly is noted. Small pericardial effusion measures 1 cm maximum thickness posteriorly. Atherosclerotic calcifications of mitral valve. Moderate size hiatal hernia measures 6.5 cm. Bilateral small pleural effusion. Bilateral basilar posterior atelectasis or infiltrate. Probable calcified granuloma right base posteriorly measures 8 mm. Hepatobiliary: No focal hepatic mass. Gallbladder is contracted without evidence of calcified gallstones. Pancreas: Enhanced pancreas is mild atrophic without focal abnormality. Spleen: Enhanced spleen is normal. Adrenals/Urinary Tract: Stable 9 mm left adrenal gland nodule. The right adrenal is unremarkable. There is subtle less enhancement of the left kidney. Delay images shows mild delay excretion of the left ureter. Findings suspicious for mild obstructive uropathy. There is nonobstructive calcified calculus in midpole of the right kidney measures 7 mm. A cyst in upper pole of the right kidney measures 3 cm. Nonobstructive calcified calculus in lower pole of the left kidney measures 7 mm. Axial image 21 there is calcified obstructive calculus in proximal left UPJ measures 10.8 mm. Minimal left hydronephrosis. There is a probable cyst mid pole of the left kidney posterior aspect measures 1.5 cm. The urinary bladder is unremarkable. Stomach/Bowel: There is no small bowel obstruction. Minimal gaseous distended distal small bowel loops within pelvis probable minimal ileus. There is no transition in caliber of small bowel. No pericecal inflammation. Partially visualized normal appendix  axial image 65. Moderate stool noted within descending colon. There is redundant sigmoid colon with moderate stool. Moderate stool and gas noted within rectum. The rectum measures 6.4 cm in diameter suspicious for mild fecal impaction. Mild thickening of anal wall. Proctitis cannot be excluded. Please see axial image 85. Clinical correlation is necessary. Vascular/Lymphatic: No aortic aneurysm. Atherosclerotic calcifications are noted abdominal aorta, SMA, celiac trunk origin and bilateral iliac arteries. There is no adenopathy. No inguinal adenopathy is noted. Reproductive: The patient is status post hysterectomy. No ascites or free abdominal air. Other: Musculoskeletal: Significant dextroscoliosis lumbar spine. Extensive degenerative changes are noted lumbar spine. Moderate degenerative changes thoracic spine. There is about 5 mm anterolisthesis L4 on L5 vertebral body. Significant disc space flattening with vacuum disc phenomenon multilevel lumbar spine. Multilevel facet degenerative changes are noted lumbar spine. IMPRESSION: 1. There is 10.8 mm calcified obstructive calculus in left UPJ. Minimal left hydronephrosis. Bilateral nonobstructive nephrolithiasis. Bilateral renal cysts. 2. Subtle mild decreased enhancement and delay excretion of the left kidney probable mild obstructive uropathy. 3. Normal appendix.  No pericecal inflammation. 4. Minimal gaseous distension of distal small bowel probable mild ileus. No evidence of small bowel obstruction. 5. Moderate stool noted in distal colon. Moderate stool and gas noted within rectum suspicious for mild fecal impaction. There is thickening of anal wall. Proctitis or inflammatory/neoplastic process cannot be excluded. Clinical correlation is necessary. 6. Status post hysterectomy. 7. Extensive degenerative changes lumbar spine. There is about 5 mm anterolisthesis L4 on L5 vertebral body. Significant levoscoliosis lumbar spine. 8. Bilateral small pleural effusion.  Bilateral basilar posterior atelectasis or infiltrate. 9. Moderate size hiatal hernia. 10. Small pericardial effusion measures 1 cm maximum thickness posteriorly. Electronically Signed   By: Lahoma Crocker M.D.   On: 12/28/2016 10:52   CT scan personally reviewed today.   Impression/Plan: 83 year old female with dementia with 11 mm left obstructing UPJ stone, fevers, Escherichia coli bacteremia. Despite aggressive antibiotics over the past week, her fevers have failed to defervesce, presumably due to  ongoing obstruction.  As such, lengthy conversation today with the patient's daughter Denise Matthews (by telephone) discussing options including comfort care versus left ureteral stent placement.  She is most interested in proceeding with left ureteral stent placement.  Risks and benefits of stent placement were discussed today in detail including risk of bleeding, infection, damage to surrounding structures, stent pain, worsening of her clinical status, need for return to the operating room/lithotripsy truck in the future for definitive stone treatment.  Given that she ate at 8 AM, we will defer stent placement to 4 PM today given that she is otherwise hemodynamically stable and has been since her presentation on 12/22/2016.  We will arrange for outpatient follow-up after her discharge to discuss definitive management of her stone.  12/28/2016, 11:56 AM  Hollice Espy,  MD

## 2016-12-28 NOTE — Progress Notes (Signed)
Initial Nutrition Assessment  DOCUMENTATION CODES:   Non-severe (moderate) malnutrition in context of chronic illness  INTERVENTION:  1. Mighty Shake II Q24H with meals, each supplement provides 480-500 kcals and 20-23 grams of protein 2. NDD1 - Nectar Thick per SLP  NUTRITION DIAGNOSIS:   Malnutrition related to chronic illness as evidenced by moderate depletion of body fat, moderate depletions of muscle mass.  GOAL:   Patient will meet greater than or equal to 90% of their needs  MONITOR:   PO intake, I & O's, Labs, Weight trends, Supplement acceptance  REASON FOR ASSESSMENT:   Low [place]    ASSESSMENT:   Denise Matthews  is a 83 y.o. female who presents with A couple days of progressively increasing confusion. She was taken to outpatient clinic and diagnosed with UTI yesterday, and started on antibiotics.  Attempted to speak with patient but she was sleeping soundly, unable to wake. No family available. Documented meal completion 25% thus far. Weight is up 24# Nutrition-Focused physical exam completed. Findings are moderate fat depletion, moderate muscle depletion, and no edema.  Labs and medications reviewed  Diet Order:  Diet NPO time specified  Skin:  Wound (see comment) (Sacrum PU 1)  Last BM:  12/26/2016  Height:   Ht Readings from Last 1 Encounters:  12/22/16 5\' 5"  (1.651 m)    Weight:   Wt Readings from Last 1 Encounters:  12/22/16 140 lb (63.5 kg)    Ideal Body Weight:  56.81 kg  BMI:  Body mass index is 23.3 kg/m.  Estimated Nutritional Needs:   Kcal:  [id number] (MSJ x1.2-1.3)  Protein:  63-76 gm  Fluid:  >/= 1.25L  EDUCATION NEEDS:   No education needs identified at this time  Satira Anis. Mclean Moya, MS, RD LDN Inpatient Clinical Dietitian Pager [phone]

## 2016-12-28 NOTE — Anesthesia Postprocedure Evaluation (Signed)
Anesthesia Post Note  Patient: Denise Matthews  Procedure(s) Performed: Procedure(s) (LRB): CYSTOSCOPY WITH STENT PLACEMENT (Left)  Patient location during evaluation: PACU Anesthesia Type: General Level of consciousness: awake and alert Pain management: pain level controlled Vital Signs Assessment: post-procedure vital signs reviewed and stable Respiratory status: spontaneous breathing, nonlabored ventilation, respiratory function stable and patient connected to nasal cannula oxygen Cardiovascular status: blood pressure returned to baseline and stable Postop Assessment: no signs of nausea or vomiting Anesthetic complications: no     Last Vitals:  Vitals:   12/28/16 1811 12/28/16 1922  BP: (!) 142/51 (!) 125/52  Pulse: 76 78  Resp:  18  Temp:  36.8 C    Last Pain:  Vitals:   12/28/16 1922  TempSrc: Oral  PainSc:                  Martha Clan

## 2016-12-28 NOTE — Brief Op Note (Signed)
12/22/2016 - 12/28/2016  5:03 PM  PATIENT:  Denise Matthews  83 y.o. female  PRE-OPERATIVE DIAGNOSIS:  kidney stone  POST-OPERATIVE DIAGNOSIS:  kidney stone  PROCEDURE:  Procedure(s): CYSTOSCOPY WITH STENT PLACEMENT (Left)  Left retrograde pyelogram  SURGEON:  Surgeon(s) and Role:    * Hollice Espy, MD - Primary  ASSISTANTS: none   ANESTHESIA:   general  EBL:  No intake/output data recorded.  Drains: 6 x 24 Fr JJ ureteral stent on left  Specimen: none  COUNTS CORRECT: YES  PLAN OF CARE: return to floor

## 2016-12-28 NOTE — Anesthesia Preprocedure Evaluation (Signed)
Anesthesia Evaluation  Patient identified by MRN, date of birth, ID band Patient awake    Reviewed: Allergy & Precautions, H&P , NPO status , Patient's Chart, lab work & pertinent test results, reviewed documented beta blocker date and time   History of Anesthesia Complications Negative for: history of anesthetic complications  Airway Mallampati: III  TM Distance: >3 FB Neck ROM: full    Dental  (+) Poor Dentition, Missing   Pulmonary neg shortness of breath, sleep apnea , neg COPD, neg recent URI,           Cardiovascular Exercise Tolerance: Good hypertension, (-) angina(-) CAD, (-) Past MI, (-) Cardiac Stents and (-) CABG (-) dysrhythmias (-) Valvular Problems/Murmurs     Neuro/Psych PSYCHIATRIC DISORDERS (Dementia) negative neurological ROS     GI/Hepatic Neg liver ROS, GERD  ,  Endo/Other  negative endocrine ROS  Renal/GU Renal disease  negative genitourinary   Musculoskeletal   Abdominal   Peds  Hematology negative hematology ROS (+)   Anesthesia Other Findings Past Medical History: No date: Arthritis No date: Atrial fibrillation (HCC) No date: Dementia No date: Heartburn No date: HTN (hypertension) No date: Recurrent UTI No date: Sleep apnea No date: Vaginal atrophy   Reproductive/Obstetrics negative OB ROS                             Anesthesia Physical Anesthesia Plan  ASA: III  Anesthesia Plan: General ETT   Post-op Pain Management:    Induction:   Airway Management Planned:   Additional Equipment:   Intra-op Plan:   Post-operative Plan:   Informed Consent: I have reviewed the patients History and Physical, chart, labs and discussed the procedure including the risks, benefits and alternatives for the proposed anesthesia with the patient or authorized representative who has indicated his/her understanding and acceptance.   Dental Advisory Given  Plan  Discussed with: Anesthesiologist, CRNA and Surgeon  Anesthesia Plan Comments:         Anesthesia Quick Evaluation

## 2016-12-28 NOTE — Progress Notes (Signed)
Returned from specials at around 1800.  Is more awake and conversant then she was prior to procedure.  VS stable.  Denies pain.

## 2016-12-28 NOTE — Progress Notes (Signed)
[facility] [place] NAME: Denise Matthews    MR#:  [id number]  DATE OF BIRTH:  [birth year]  SUBJECTIVE: Patient had a high fever. Yesterday evening. Chest xray is negative for pneumonia.repeat blood cultures were negative.   CHIEF COMPLAINT:   Chief Complaint  Patient presents with  . Altered Mental Status    Occasional cough. Sleepy No pai Tmax 102  REVIEW OF SYSTEMS:   Review of Systems  Unable to perform ROS: Dementia    DRUG ALLERGIES:   Allergies  Allergen Reactions  . Cholecalciferol Other (See Comments)  . Nitrofurantoin Macrocrystal Itching and Rash    VITALS:  Blood pressure (!) 161/74, pulse 79, temperature 98.4 F (36.9 C), temperature source Oral, resp. rate 18, height 5\' 5"  (1.651 m), weight 63.5 kg (140 lb), SpO2 100 %.  PHYSICAL EXAMINATION:  GENERAL:  83 y.o.-year-old patient lying in the bed with no acute distress. Demented and hard of hearing. EYES: Pupils equal, round, reactive to light and accommodation. No scleral icterus. Extraocular muscles intact.  HEENT: Head atraumatic, normocephalic. Oropharynx and nasopharynx clear.  NECK:  Supple, no jugular venous distention. No thyroid enlargement, no tenderness.  LUNGS: Normal breath sounds bilaterally, no wheezing, rales,rhonchi or crepitation. No use of accessory muscles of respiration.  CARDIOVASCULAR: S1, S2 normal. No murmurs, rubs, or gallops.  ABDOMEN: Soft, nontender, nondistended. Bowel sounds present. No organomegaly or mass.  EXTREMITIES: No pedal edema, cyanosis, or clubbing.  NEUROLOGIC: unAble to do full neurological exam because of dementia.  PSYCHIATRIC: The patient is awake but Confused.   LABORATORY PANEL:   CBC  Recent Labs Lab 12/25/16 0606  WBC 11.0  HGB 10.2*  HCT 30.1*  PLT 169   ------------------------------------------------------------------------------------------------------------------  Chemistries   Recent  Labs Lab 12/22/16 1902  12/27/16 0429  NA 141  < > 143  K 3.3*  < > 3.5  CL 103  < > 111  CO2 29  < > 27  GLUCOSE 117*  < > 96  BUN 25*  < > 15  CREATININE 0.76  < > 0.62  CALCIUM 9.0  < > 8.1*  AST 43*  --   --   ALT 28  --   --   ALKPHOS 90  --   --   BILITOT 0.8  --   --   < > = values in this interval not displayed. ------------------------------------------------------------------------------------------------------------------  Cardiac Enzymes No results for input(s): TROPONINI in the last 168 hours. ------------------------------------------------------------------------------------------------------------------  RADIOLOGY:  Ct Abdomen Pelvis W Contrast  Result Date: 12/28/2016 CLINICAL DATA:  Altered mental status, fever EXAM: CT ABDOMEN AND PELVIS WITH CONTRAST TECHNIQUE: Multidetector CT imaging of the abdomen and pelvis was performed using the standard protocol following bolus administration of intravenous contrast. CONTRAST:  77mL ISOVUE-300 IOPAMIDOL (ISOVUE-300) INJECTION 61% COMPARISON:  CT scan 09/02/2010 FINDINGS: Lower chest: Cardiomegaly is noted. Small pericardial effusion measures 1 cm maximum thickness posteriorly. Atherosclerotic calcifications of mitral valve. Moderate size hiatal hernia measures 6.5 cm. Bilateral small pleural effusion. Bilateral basilar posterior atelectasis or infiltrate. Probable calcified granuloma right base posteriorly measures 8 mm. Hepatobiliary: No focal hepatic mass. Gallbladder is contracted without evidence of calcified gallstones. Pancreas: Enhanced pancreas is mild atrophic without focal abnormality. Spleen: Enhanced spleen is normal. Adrenals/Urinary Tract: Stable 9 mm left adrenal gland nodule. The right adrenal is unremarkable. There is subtle less enhancement of the left kidney. Delay images shows mild delay excretion of the left ureter. Findings suspicious for  mild obstructive uropathy. There is nonobstructive calcified calculus  in midpole of the right kidney measures 7 mm. A cyst in upper pole of the right kidney measures 3 cm. Nonobstructive calcified calculus in lower pole of the left kidney measures 7 mm. Axial image 21 there is calcified obstructive calculus in proximal left UPJ measures 10.8 mm. Minimal left hydronephrosis. There is a probable cyst mid pole of the left kidney posterior aspect measures 1.5 cm. The urinary bladder is unremarkable. Stomach/Bowel: There is no small bowel obstruction. Minimal gaseous distended distal small bowel loops within pelvis probable minimal ileus. There is no transition in caliber of small bowel. No pericecal inflammation. Partially visualized normal appendix axial image 65. Moderate stool noted within descending colon. There is redundant sigmoid colon with moderate stool. Moderate stool and gas noted within rectum. The rectum measures 6.4 cm in diameter suspicious for mild fecal impaction. Mild thickening of anal wall. Proctitis cannot be excluded. Please see axial image 85. Clinical correlation is necessary. Vascular/Lymphatic: No aortic aneurysm. Atherosclerotic calcifications are noted abdominal aorta, SMA, celiac trunk origin and bilateral iliac arteries. There is no adenopathy. No inguinal adenopathy is noted. Reproductive: The patient is status post hysterectomy. No ascites or free abdominal air. Other: Musculoskeletal: Significant dextroscoliosis lumbar spine. Extensive degenerative changes are noted lumbar spine. Moderate degenerative changes thoracic spine. There is about 5 mm anterolisthesis L4 on L5 vertebral body. Significant disc space flattening with vacuum disc phenomenon multilevel lumbar spine. Multilevel facet degenerative changes are noted lumbar spine. IMPRESSION: 1. There is 10.8 mm calcified obstructive calculus in left UPJ. Minimal left hydronephrosis. Bilateral nonobstructive nephrolithiasis. Bilateral renal cysts. 2. Subtle mild decreased enhancement and delay excretion of  the left kidney probable mild obstructive uropathy. 3. Normal appendix.  No pericecal inflammation. 4. Minimal gaseous distension of distal small bowel probable mild ileus. No evidence of small bowel obstruction. 5. Moderate stool noted in distal colon. Moderate stool and gas noted within rectum suspicious for mild fecal impaction. There is thickening of anal wall. Proctitis or inflammatory/neoplastic process cannot be excluded. Clinical correlation is necessary. 6. Status post hysterectomy. 7. Extensive degenerative changes lumbar spine. There is about 5 mm anterolisthesis L4 on L5 vertebral body. Significant levoscoliosis lumbar spine. 8. Bilateral small pleural effusion. Bilateral basilar posterior atelectasis or infiltrate. 9. Moderate size hiatal hernia. 10. Small pericardial effusion measures 1 cm maximum thickness posteriorly. Electronically Signed   By: Lahoma Crocker M.D.   On: 12/28/2016 10:52    EKG:   Orders placed or performed during the hospital encounter of 12/22/16  . EKG 12-Lead  . EKG 12-Lead    ASSESSMENT AND PLAN:   *  Escherichia coli Bacteremia secondary to UTI. Acute encephalopathy is improving. Sepsis  Continues to have fever.  Tmax 102   IV ciprofloxacin. CT abdomen showed Left UPJ obstruction with 10.5 mm stone. Discussed with Dr. Erlene Quan. OR later today. Surprisingly patient has no pain or tenderness on exam. . * dementia  SNF at discharge  All the records are reviewed and case discussed with Care Management/Social Workerr. Management plans discussed with the patient, family and they are in agreement.  CODE STATUS: DNR  TOTAL TIME TAKING CARE OF THIS PATIENT: 35 minutes.    Hillary Bow R M.D on 12/28/2016 at 1:33 PM  Between 7am to 6pm - Pager - [phone]  After 6pm go to [email or web address] - password [email or web address] [facility]  Office  [phone]  CC: Primary care physician; Rusty Aus, MD  Note: This dictation was prepared with  Dragon dictation along with smaller phrase technology. Any transcriptional errors that result from this process are unintentional.

## 2016-12-28 NOTE — Transfer of Care (Signed)
Immediate Anesthesia Transfer of Care Note  Patient: Denise Matthews  Procedure(s) Performed: Procedure(s): CYSTOSCOPY WITH STENT PLACEMENT (Left)  Patient Location: PACU  Anesthesia Type:General  Level of Consciousness: patient cooperative and lethargic  Airway & Oxygen Therapy: Patient Spontanous Breathing and Patient connected to face mask oxygen  Post-op Assessment: Report given to RN and Post -op Vital signs reviewed and stable  Post vital signs: Reviewed and stable  Last Vitals:  Vitals:   12/28/16 1452 12/28/16 1707  BP:  (!) 161/57  Pulse:  82  Resp:  (!) 22  Temp: (!) 38.1 C 37 C    Last Pain:  Vitals:   12/28/16 1452  TempSrc: Rectal  PainSc:          Complications: No apparent anesthesia complications

## 2016-12-29 ENCOUNTER — Encounter: Payer: Self-pay | Admitting: Urology

## 2016-12-29 LAB — CREATININE, SERUM
Creatinine, Ser: 0.65 mg/dL (ref 0.44–1.00)
GFR calc Af Amer: >60 mL/min (ref >60)
GFR calc non Af Amer: >60 mL/min (ref >60)

## 2016-12-29 NOTE — Progress Notes (Signed)
[facility] [place] NAME: Denise Matthews    MR#:  [id number]  DATE OF BIRTH:  August 20, 1920  SUBJECTIVE: Patient had a high fever. Yesterday evening. Chest xray is negative for pneumonia.repeat blood cultures were negative.   CHIEF COMPLAINT:   Chief Complaint  Patient presents with  . Altered Mental Status    Occasional cough. Sleepy No pai Tmax 102  REVIEW OF SYSTEMS:   Review of Systems  Unable to perform ROS: Dementia    DRUG ALLERGIES:   Allergies  Allergen Reactions  . Cholecalciferol Other (See Comments)  . Nitrofurantoin Macrocrystal Itching and Rash    VITALS:  Blood pressure (!) 126/44, pulse 82, temperature 98 F (36.7 C), temperature source Oral, resp. rate 16, height 5\' 5"  (1.651 m), weight 63.5 kg (140 lb), SpO2 97 %.  PHYSICAL EXAMINATION:  GENERAL:  83 y.o.-year-old patient lying in the bed with no acute distress. Demented and hard of hearing. EYES: Pupils equal, round, reactive to light and accommodation. No scleral icterus. Extraocular muscles intact.  HEENT: Head atraumatic, normocephalic. Oropharynx and nasopharynx clear.  NECK:  Supple, no jugular venous distention. No thyroid enlargement, no tenderness.  LUNGS: Normal breath sounds bilaterally, no wheezing, rales,rhonchi or crepitation. No use of accessory muscles of respiration.  CARDIOVASCULAR: S1, S2 normal. No murmurs, rubs, or gallops.  ABDOMEN: Soft, nontender, nondistended. Bowel sounds present. No organomegaly or mass.  EXTREMITIES: No pedal edema, cyanosis, or clubbing.  NEUROLOGIC: unAble to do full neurological exam because of dementia.  PSYCHIATRIC: The patient is awake but Confused.   LABORATORY PANEL:   CBC  Recent Labs Lab 12/25/16 0606  WBC 11.0  HGB 10.2*  HCT 30.1*  PLT 169   ------------------------------------------------------------------------------------------------------------------  Chemistries   Recent Labs Lab  12/27/16 0429 12/29/16 0709  NA 143  --   K 3.5  --   CL 111  --   CO2 27  --   GLUCOSE 96  --   BUN 15  --   CREATININE 0.62 0.65  CALCIUM 8.1*  --    ------------------------------------------------------------------------------------------------------------------  Cardiac Enzymes No results for input(s): TROPONINI in the last 168 hours. ------------------------------------------------------------------------------------------------------------------  RADIOLOGY:  Ct Abdomen Pelvis W Contrast  Result Date: 12/28/2016 CLINICAL DATA:  Altered mental status, fever EXAM: CT ABDOMEN AND PELVIS WITH CONTRAST TECHNIQUE: Multidetector CT imaging of the abdomen and pelvis was performed using the standard protocol following bolus administration of intravenous contrast. CONTRAST:  18mL ISOVUE-300 IOPAMIDOL (ISOVUE-300) INJECTION 61% COMPARISON:  CT scan 09/02/2010 FINDINGS: Lower chest: Cardiomegaly is noted. Small pericardial effusion measures 1 cm maximum thickness posteriorly. Atherosclerotic calcifications of mitral valve. Moderate size hiatal hernia measures 6.5 cm. Bilateral small pleural effusion. Bilateral basilar posterior atelectasis or infiltrate. Probable calcified granuloma right base posteriorly measures 8 mm. Hepatobiliary: No focal hepatic mass. Gallbladder is contracted without evidence of calcified gallstones. Pancreas: Enhanced pancreas is mild atrophic without focal abnormality. Spleen: Enhanced spleen is normal. Adrenals/Urinary Tract: Stable 9 mm left adrenal gland nodule. The right adrenal is unremarkable. There is subtle less enhancement of the left kidney. Delay images shows mild delay excretion of the left ureter. Findings suspicious for mild obstructive uropathy. There is nonobstructive calcified calculus in midpole of the right kidney measures 7 mm. A cyst in upper pole of the right kidney measures 3 cm. Nonobstructive calcified calculus in lower pole of the left kidney  measures 7 mm. Axial image 21 there is calcified obstructive calculus in proximal left UPJ  measures 10.8 mm. Minimal left hydronephrosis. There is a probable cyst mid pole of the left kidney posterior aspect measures 1.5 cm. The urinary bladder is unremarkable. Stomach/Bowel: There is no small bowel obstruction. Minimal gaseous distended distal small bowel loops within pelvis probable minimal ileus. There is no transition in caliber of small bowel. No pericecal inflammation. Partially visualized normal appendix axial image 65. Moderate stool noted within descending colon. There is redundant sigmoid colon with moderate stool. Moderate stool and gas noted within rectum. The rectum measures 6.4 cm in diameter suspicious for mild fecal impaction. Mild thickening of anal wall. Proctitis cannot be excluded. Please see axial image 85. Clinical correlation is necessary. Vascular/Lymphatic: No aortic aneurysm. Atherosclerotic calcifications are noted abdominal aorta, SMA, celiac trunk origin and bilateral iliac arteries. There is no adenopathy. No inguinal adenopathy is noted. Reproductive: The patient is status post hysterectomy. No ascites or free abdominal air. Other: Musculoskeletal: Significant dextroscoliosis lumbar spine. Extensive degenerative changes are noted lumbar spine. Moderate degenerative changes thoracic spine. There is about 5 mm anterolisthesis L4 on L5 vertebral body. Significant disc space flattening with vacuum disc phenomenon multilevel lumbar spine. Multilevel facet degenerative changes are noted lumbar spine. IMPRESSION: 1. There is 10.8 mm calcified obstructive calculus in left UPJ. Minimal left hydronephrosis. Bilateral nonobstructive nephrolithiasis. Bilateral renal cysts. 2. Subtle mild decreased enhancement and delay excretion of the left kidney probable mild obstructive uropathy. 3. Normal appendix.  No pericecal inflammation. 4. Minimal gaseous distension of distal small bowel probable mild  ileus. No evidence of small bowel obstruction. 5. Moderate stool noted in distal colon. Moderate stool and gas noted within rectum suspicious for mild fecal impaction. There is thickening of anal wall. Proctitis or inflammatory/neoplastic process cannot be excluded. Clinical correlation is necessary. 6. Status post hysterectomy. 7. Extensive degenerative changes lumbar spine. There is about 5 mm anterolisthesis L4 on L5 vertebral body. Significant levoscoliosis lumbar spine. 8. Bilateral small pleural effusion. Bilateral basilar posterior atelectasis or infiltrate. 9. Moderate size hiatal hernia. 10. Small pericardial effusion measures 1 cm maximum thickness posteriorly. Electronically Signed   By: Lahoma Crocker M.D.   On: 12/28/2016 10:52    EKG:   Orders placed or performed during the hospital encounter of 12/22/16  . EKG 12-Lead  . EKG 12-Lead    ASSESSMENT AND PLAN:   * Left UPJ obstruction with 10.5 MM stone S/p stent placement No pain  *  Escherichia coli Bacteremia secondary to UTI. Acute encephalopathy is improving. Sepsis  Continues to have fever.   IV ciprofloxacin. Now afebrile  * dementia  SNF at discharge  All the records are reviewed and case discussed with Care Management/Social Workerr. Management plans discussed with the patient, family and they are in agreement.  CODE STATUS: DNR  TOTAL TIME TAKING CARE OF THIS PATIENT: 35 minutes.    Hillary Bow R M.D on 12/29/2016 at 7:41 PM  Between 7am to 6pm - Pager - [phone]  After 6pm go to [email or web address] - password [email or web address] [facility]  Office  [phone]  CC: Primary care physician; Rusty Aus, MD   Note: This dictation was prepared with Dragon dictation along with smaller phrase technology. Any transcriptional errors that result from this process are unintentional.

## 2016-12-29 NOTE — Clinical Social Work Note (Signed)
CSW presented bed offers to patient's daughter who was at bedside, patient's daughter requested that CSW also check with [facility] [place].  CSW contacted [facility] who said they will review patient's information and contact CSW on Friday morning, CSW updated patient's daughter.  Jones Broom. [place], MSW, [place]  12/29/2016

## 2016-12-29 NOTE — Op Note (Signed)
Date of procedure: 12/28/2016  Preoperative diagnosis:  1. Left UPJ stone 2. Left ureteral obstruction 3. Sepsis/bacteremia   Postoperative diagnosis:  1. Same as above   Procedure: 1. Left retrograde pyelogram 2. Cystoscopy, left ureteral stent placement  Surgeon: Hollice Espy, MD  Anesthesia: General  Complications: None  Intraoperative findings: Mildly tortuous ureter without obvious hydroureteronephrosis. Filling defect at the UPJ appreciated. Stent placed without difficulty. Significant debris within the bladder.  EBL: Minimal  Specimens: None  Drains: 6 x 24 French double-J ureteral stent on left  Indication: [name] is a 83 y.o. patient with Escherichia coli bacteremia/sepsis has been admitted since 12/22/2016 who continues to spike fever. As part of her further workup, she underwent CT scan which showed an 11 mm left UPJ stone with concern for obstruction.  After reviewing the management options for treatment with the patient's daughter, [name] her healthcare proxy, she elected to proceed with the above surgical procedure(s). We have discussed the potential benefits and risks of the procedure, side effects of the proposed treatment, the likelihood of the patient achieving the goals of the procedure, and any potential problems that might occur during the procedure or recuperation. Informed consent has been obtained.  Description of procedure:  The patient was taken to the operating room and general anesthesia was induced.  The patient was placed in the dorsal lithotomy position, prepped and draped in the usual sterile fashion, and preoperative antibiotics were administered. A preoperative time-out was performed.   A 21 French cystoscope was advanced per urethra into the bladder. Inspection of the bladder revealed a significant amount of debris and erythema consistent with recent cystitis. Attention was turned to the left ureteral orifice which was cannulated using a 5  [place] open-ended ureteral catheter just within the UO. A gentle retrograde pyelogram was performed using 10 cc of full-strength Conray. This revealed a mildly tortuous ureter without any obvious hydroureteronephrosis. There was a filling defect at the UPJ appreciated consistent with her known stone. The wire was then advanced up to the upper pole collecting system without difficulty. A 6 French 24 French double-J ureteral stent was advanced up to level of the kidney.  The wires and partially withdrawn until a coil was noted within the renal pelvis. The wire was then fully withdrawn and a full coil was noted within the bladder. The bladder was then drained and the scope was removed. She tolerated the procedure well. She was reversed from anesthesia, taken to the PACU in stable condition.  Plan: I called the patient's daughter, [name] discuss intraoperative findings.  She'll return to the floor for continued supportive care. She has a follow-up to be arranged with our service. Urology will sign off, please let us know of any concerns or questions.  Hollice Espy, M.D.

## 2016-12-29 NOTE — Progress Notes (Signed)
Speech Language Pathology Treatment: Dysphagia  Patient Details Name: Denise Matthews MRN: [id number] DOB: July 20, 1920 Today's Date: 12/29/2016 Time: [id number] SLP Time Calculation (min) (ACUTE ONLY): 45 min  Assessment / Plan / Recommendation Clinical Impression  Pt seen for toleration of current dysphagia diet and for trials of thin liquids to hopefully upgrade her diet consistency. Pt continues to be easily distracted w/ moderate+ confusion. She has refused eating and taking pills w/ NSG per report. Pt was quite talkative and engaged in conversation w/ SLP. Between topics, pt was positioned upright in bed and given small sip trials of thin liquids via cup, then straw(Sprite per her request). No overt s/s of aspiration were noted as she accepted trials - strict aspiration precautions were followed d/t pt's declined Cognitive status. Discussed the need to use small sips slowly w/ pt; education on other aspiration precautions as well given. Unsure of pt's full comprehension of instruction d/t baseline Cognitive status. Precautions posted in room.  Pt continues to present w/ increased risk for aspiration d/t Cognitive status. ST will f/u to monitor status, give trials to upgrade diet consistency if able; education. NSG updated.    HPI        SLP Plan  Continue with current plan of care     Recommendations  Diet recommendations: Dysphagia 1 (puree);Thin liquid Liquids provided via: Cup;Straw Medication Administration: Whole meds with puree (crushed in puree if needed/able to) Supervision: Patient able to self feed;Staff to assist with self feeding;Full supervision/cueing for compensatory strategies Compensations: Minimize environmental distractions;Slow rate;Small sips/bites;Lingual sweep for clearance of pocketing;Follow solids with liquid Postural Changes and/or Swallow Maneuvers: Seated upright 90 degrees;Upright 30-60 min after meal                General recommendations:  (Dietician  consult) Oral Care Recommendations: Oral care BID;Staff/trained caregiver to provide oral care Follow up Recommendations: Skilled Nursing facility Plan: Continue with current plan of care       [place], [place], CCC-SLP Lando Alcalde 12/29/2016, 2:43 PM

## 2016-12-29 NOTE — Progress Notes (Signed)
Patient refuses to take medications.  I think she will take them wwhen her daughter is here.  She will be in this afternoon.

## 2016-12-29 NOTE — Progress Notes (Signed)
PT Cancellation Note  Patient Details Name: Denise Matthews MRN: [id number] DOB: Jan 31, 1920   Cancelled Treatment:    Reason Eval/Treat Not Completed: Other (comment) (Per chart review, patient status post L uretal stent placement under general anesthesia.  Per policy, will require new orders to resume PT services.  Please re-consult as medically appropriate.)   Rolande Moe H. Owens Shark, PT, DPT, NCS 12/29/16, 8:25 AM [phone]

## 2016-12-29 NOTE — Clinical Social Work Placement (Signed)
   CLINICAL SOCIAL WORK PLACEMENT  NOTE  Date:  12/29/2016  Patient Details  Name: Denise Matthews MRN: [id number] Date of Birth: 10-06-1920  Clinical Social Work is seeking post-discharge placement for this patient at the [place] level of care (*CSW will initial, date and re-position this form in  chart as items are completed):  Yes   Patient/family provided with [facility] Work Department's list of facilities offering this level of care within the geographic area requested by the patient (or if unable, by the patient's family).  Yes   Patient/family informed of their freedom to choose among providers that offer the needed level of care, that participate in Medicare, Medicaid or managed care program needed by the patient, have an available bed and are willing to accept the patient.  Yes   Patient/family informed of [facility]'s ownership interest in [facility] and [facility], as well as of the fact that they are under no obligation to receive care at these facilities.  PASRR submitted to EDS on 12/29/16     PASRR number received on       Existing PASRR number confirmed on 12/29/16     FL2 transmitted to all facilities in geographic area requested by pt/family on       FL2 transmitted to all facilities within larger geographic area on       Patient informed that his/her managed care company has contracts with or will negotiate with certain facilities, including the following:            Patient/family informed of bed offers received.  Patient chooses bed at       Physician recommends and patient chooses bed at      Patient to be transferred to   on  .  Patient to be transferred to facility by       Patient family notified on   of transfer.  Name of family member notified:        PHYSICIAN       Additional Comment:    _______________________________________________ Ross Ludwig, LCSWA 12/29/2016, 10:33 PM

## 2016-12-30 ENCOUNTER — Telehealth: Payer: Self-pay | Admitting: Urology

## 2016-12-30 DIAGNOSIS — A419 Sepsis, unspecified organism: Secondary | ICD-10-CM | POA: Diagnosis not present

## 2016-12-30 DIAGNOSIS — R7881 Bacteremia: Secondary | ICD-10-CM | POA: Diagnosis not present

## 2016-12-30 DIAGNOSIS — M6281 Muscle weakness (generalized): Secondary | ICD-10-CM | POA: Diagnosis not present

## 2016-12-30 DIAGNOSIS — R1312 Dysphagia, oropharyngeal phase: Secondary | ICD-10-CM | POA: Diagnosis not present

## 2016-12-30 DIAGNOSIS — R41841 Cognitive communication deficit: Secondary | ICD-10-CM | POA: Diagnosis not present

## 2016-12-30 DIAGNOSIS — F0150 Vascular dementia without behavioral disturbance: Secondary | ICD-10-CM | POA: Diagnosis not present

## 2016-12-30 DIAGNOSIS — Z7401 Bed confinement status: Secondary | ICD-10-CM | POA: Diagnosis not present

## 2016-12-30 DIAGNOSIS — Z789 Other specified health status: Secondary | ICD-10-CM | POA: Diagnosis not present

## 2016-12-30 DIAGNOSIS — I10 Essential (primary) hypertension: Secondary | ICD-10-CM | POA: Diagnosis not present

## 2016-12-30 DIAGNOSIS — Z5189 Encounter for other specified aftercare: Secondary | ICD-10-CM | POA: Diagnosis not present

## 2016-12-30 DIAGNOSIS — R4182 Altered mental status, unspecified: Secondary | ICD-10-CM | POA: Diagnosis not present

## 2016-12-30 DIAGNOSIS — N135 Crossing vessel and stricture of ureter without hydronephrosis: Secondary | ICD-10-CM | POA: Diagnosis not present

## 2016-12-30 DIAGNOSIS — B9629 Other Escherichia coli [E. coli] as the cause of diseases classified elsewhere: Secondary | ICD-10-CM | POA: Diagnosis not present

## 2016-12-30 DIAGNOSIS — N390 Urinary tract infection, site not specified: Secondary | ICD-10-CM | POA: Diagnosis not present

## 2016-12-30 DIAGNOSIS — N201 Calculus of ureter: Secondary | ICD-10-CM | POA: Diagnosis not present

## 2016-12-30 DIAGNOSIS — D510 Vitamin B12 deficiency anemia due to intrinsic factor deficiency: Secondary | ICD-10-CM | POA: Diagnosis not present

## 2016-12-30 LAB — CULTURE, BLOOD (ROUTINE X 2)
Culture: NO GROWTH
Culture: NO GROWTH

## 2016-12-30 MED ORDER — CIPROFLOXACIN HCL 500 MG PO TABS: 500.0000 mg | ORAL_TABLET | Freq: Two times a day (BID) | ORAL | 0 refills | Status: DC

## 2016-12-30 MED ORDER — TRAMADOL-ACETAMINOPHEN 37.5-325 MG PO TABS: 1.0000 | ORAL_TABLET | Freq: Four times a day (QID) | ORAL | 0 refills | Status: AC | PRN

## 2016-12-30 NOTE — Clinical Social Work Note (Addendum)
CSW received authorization number from insurance companty for patient to go to SNF.  Authorization number is [id number], patient to be d/c'ed today to [facility] [place].  Patient and family agreeable to plans will transport via ems RN to call report to Yaakov Guthrie at [phone] Room 606.  Evette Cristal, MSW, [place]

## 2016-12-30 NOTE — Progress Notes (Signed)
OT Cancellation Note  Patient Details Name: RAYLEY CASO MRN: [id number] DOB: [birth year]   Cancelled Treatment:    Reason Eval/Treat Not Completed: Medical issues which prohibited therapy. Per chart review, patient status post L uretal stent placement under general anesthesia.  Per policy, will require new orders to resume OT services.  Please re-consult as medically appropriate.  Chrys Racer, OTR/L ascom [phone] 12/30/16, 1:07 PM

## 2016-12-30 NOTE — Care Management Important Message (Signed)
Important Message  Patient Details  Name: Denise Matthews MRN: [id number] Date of Birth: 1920/09/29   Medicare Important Message Given:  Yes    Katrina Stack, RN 12/30/2016, 8:41 AM

## 2016-12-30 NOTE — Discharge Summary (Signed)
[facility] NAME: Denise Matthews    MR#:  [id number]  DATE OF BIRTH:  September 08, 1920  DATE OF ADMISSION:  12/22/2016 ADMITTING PHYSICIAN: Lance Coon, MD  DATE OF DISCHARGE: 12/30/2016  PRIMARY CARE PHYSICIAN: Rusty Aus, MD   ADMISSION DIAGNOSIS:  Sepsis, due to unspecified organism ([place]) [A41.9] Altered mental status, unspecified altered mental status type [R41.82] Community acquired pneumonia of right upper lobe of lung ([place]) [J18.1]  DISCHARGE DIAGNOSIS:  Principal Problem:   Sepsis ([place]) Active Problems:   CAP (community acquired pneumonia)   HTN (hypertension)   Dementia   Atrial fibrillation ([place])   UTI (urinary tract infection)   Pressure injury of skin   Obstruction of left ureteropelvic junction (UPJ) due to stone   SECONDARY DIAGNOSIS:   Past Medical History:  Diagnosis Date  . Arthritis   . Atrial fibrillation ([place])   . Dementia   . Heartburn   . HTN (hypertension)   . Recurrent UTI   . Sleep apnea   . Vaginal atrophy      ADMITTING HISTORY  HISTORY OF PRESENT ILLNESS:  Denise Matthews  is a 83 y.o. female who presents with A couple days of progressively increasing confusion. She was taken to outpatient clinic and diagnosed with UTI yesterday, and started on antibiotics. However, her symptoms today progressed and she had high fevers and was brought to the ED for evaluation. Here she was found to be septic with likely pneumonia on her x-ray. Hospitalists were called for admission   HOSPITAL COURSE:   * Left UPJ obstruction with 10.5 MM stone S/p stent placement 12/28/2016. No pain No pain F/u with Dr. Erlene Quan in 2 weeks  *  Escherichia coli Bacteremia secondary to UTI. Acute encephalopathy is improving. Sepsis  Afebrile today. 99 f on Rectal   IV ciprofloxacin. Will switch to PO cipro for 12 more days Now afebrile  * dementia with inpatient delirium Slowly improving  Discharge to SNF when bed  available   CONSULTS OBTAINED:  Treatment Team:  Hollice Espy, MD  DRUG ALLERGIES:   Allergies  Allergen Reactions  . Cholecalciferol Other (See Comments)  . Nitrofurantoin Macrocrystal Itching and Rash    DISCHARGE MEDICATIONS:   Current Discharge Medication List    START taking these medications   Details  ciprofloxacin (CIPRO) 500 MG tablet Take 1 tablet (500 mg total) by mouth 2 (two) times daily. Qty: 24 tablet, Refills: 0      CONTINUE these medications which have CHANGED   Details  traMADol-acetaminophen (ULTRACET) 37.5-325 MG tablet Take 1 tablet by mouth every 6 (six) hours as needed for severe pain. Qty: 20 tablet, Refills: 0      CONTINUE these medications which have NOT CHANGED   Details  acetaminophen (TYLENOL) 500 MG tablet Take by mouth.    calcium-vitamin D (CALCIUM 500/D) 500-200 MG-UNIT tablet Take 1 tablet by mouth 2 (two) times daily.     conjugated estrogens (PREMARIN) vaginal cream Place vaginally daily. Apply 0.5mg  (pea-sized amount)  just inside the vaginal introitus with a finger-tip every night for two weeks and then Monday, Wednesday and Friday nights.    Cyanocobalamin (RA VITAMIN B-12 TR) 1000 MCG TBCR Take 1 tablet by mouth daily.     D-MANNOSE PO Take 1 capsule by mouth 2 (two) times daily.    donepezil (ARICEPT) 5 MG tablet Take 5 mg by mouth at bedtime.     metoprolol succinate (TOPROL-XL) 50 MG 24  hr tablet Take 25 mg by mouth daily.     Multiple Vitamins-Minerals (PRESERVISION AREDS PO) Take 1 capsule by mouth 2 (two) times daily.     QUEtiapine (SEROQUEL) 25 MG tablet Take 25 mg by mouth at bedtime.     sennosides-docusate sodium (SENOKOT-S) 8.6-50 MG tablet Take 2 tablets by mouth 2 (two) times daily.       STOP taking these medications     cefUROXime (CEFTIN) 250 MG tablet         Today   VITAL SIGNS:  Blood pressure (!) 142/52, pulse 80, temperature 99.1 F (37.3 C), temperature source Rectal, resp. rate (!)  22, height 5\' 5"  (1.651 m), weight 63.5 kg (140 lb), SpO2 97 %.  I/O:   Intake/Output Summary (Last 24 hours) at 12/30/16 1217 Last data filed at 12/29/16 1855  Gross per 24 hour  Intake              200 ml  Output                0 ml  Net              200 ml    PHYSICAL EXAMINATION:  Physical Exam  GENERAL:  83 y.o.-year-old patient lying in the bed with no acute distress.  LUNGS: Normal breath sounds bilaterally, no wheezing, rales,rhonchi or crepitation. No use of accessory muscles of respiration.  CARDIOVASCULAR: S1, S2 normal. No murmurs, rubs, or gallops.  ABDOMEN: Soft, non-tender, non-distended. Bowel sounds present. No organomegaly or mass.  NEUROLOGIC: Moves all 4 extremities. PSYCHIATRIC: The patient is alert and awake.   DATA REVIEW:   CBC  Recent Labs Lab 12/25/16 0606  WBC 11.0  HGB 10.2*  HCT 30.1*  PLT 169    Chemistries   Recent Labs Lab 12/27/16 0429 12/29/16 0709  NA 143  --   K 3.5  --   CL 111  --   CO2 27  --   GLUCOSE 96  --   BUN 15  --   CREATININE 0.62 0.65  CALCIUM 8.1*  --     Cardiac Enzymes No results for input(s): TROPONINI in the last 168 hours.  Microbiology Results  Results for orders placed or performed during the hospital encounter of 12/22/16  Blood Culture (routine x 2)     Status: Abnormal   Collection Time: 12/22/16  7:01 PM  Result Value Ref Range Status   Specimen Description BLOOD RIGHT FA  Final   Special Requests   Final    BOTTLES DRAWN AEROBIC AND ANAEROBIC AER 22ML ANA 22ML   Culture  Setup Time   Final    GRAM NEGATIVE RODS IN BOTH AEROBIC AND ANAEROBIC BOTTLES CRITICAL VALUE NOTED.  VALUE IS CONSISTENT WITH PREVIOUSLY REPORTED AND CALLED VALUE.    Culture (A)  Final    ESCHERICHIA COLI SUSCEPTIBILITIES PERFORMED ON PREVIOUS CULTURE WITHIN THE LAST 5 DAYS. Performed at [facility]    Report Status 12/26/2016 FINAL  Final  Urine culture     Status: Abnormal   Collection Time: 12/22/16   7:01 PM  Result Value Ref Range Status   Specimen Description URINE, RANDOM  Final   Special Requests NONE  Final   Culture MULTIPLE SPECIES PRESENT, SUGGEST RECOLLECTION (A)  Final   Report Status 12/24/2016 FINAL  Final  Blood Culture (routine x 2)     Status: Abnormal   Collection Time: 12/22/16  7:02 PM  Result Value Ref Range Status  Specimen Description BLOOD RIGHT FA  Final   Special Requests   Final    BOTTLES DRAWN AEROBIC AND ANAEROBIC AER 17ML ANA 18ML   Culture  Setup Time   Final    GRAM NEGATIVE RODS IN BOTH AEROBIC AND ANAEROBIC BOTTLES CRITICAL RESULT CALLED TO, READ BACK BY AND VERIFIED WITH: Tobie Lords @ [id number] 12/23/16 by [facility]    Culture ESCHERICHIA COLI (A)  Final   Report Status 12/26/2016 FINAL  Final   Organism ID, Bacteria ESCHERICHIA COLI  Final      Susceptibility   Escherichia coli - MIC*    AMPICILLIN >=32 RESISTANT Resistant     CEFAZOLIN >=64 RESISTANT Resistant     CEFEPIME <=1 SENSITIVE Sensitive     CEFTAZIDIME <=1 SENSITIVE Sensitive     CEFTRIAXONE <=1 SENSITIVE Sensitive     CIPROFLOXACIN <=0.25 SENSITIVE Sensitive     GENTAMICIN <=1 SENSITIVE Sensitive     IMIPENEM <=0.25 SENSITIVE Sensitive     TRIMETH/SULFA <=20 SENSITIVE Sensitive     AMPICILLIN/SULBACTAM >=32 RESISTANT Resistant     PIP/TAZO 64 INTERMEDIATE Intermediate     * ESCHERICHIA COLI  Blood Culture ID Panel (Reflexed)     Status: Abnormal   Collection Time: 12/22/16  7:02 PM  Result Value Ref Range Status   Enterococcus species NOT DETECTED NOT DETECTED Final   Listeria monocytogenes NOT DETECTED NOT DETECTED Final   Staphylococcus species NOT DETECTED NOT DETECTED Final   Staphylococcus aureus NOT DETECTED NOT DETECTED Final   Streptococcus species NOT DETECTED NOT DETECTED Final   Streptococcus agalactiae NOT DETECTED NOT DETECTED Final   Streptococcus pneumoniae NOT DETECTED NOT DETECTED Final   Streptococcus pyogenes NOT DETECTED NOT DETECTED Final   Acinetobacter  baumannii NOT DETECTED NOT DETECTED Final   Enterobacteriaceae species DETECTED (A) NOT DETECTED Final    Comment: CRITICAL RESULT CALLED TO, READ BACK BY AND VERIFIED WITH: Tobie Lords @ [id number] 12/23/16 by [place]    Enterobacter cloacae complex NOT DETECTED NOT DETECTED Final   Escherichia coli DETECTED (A) NOT DETECTED Final    Comment: CRITICAL RESULT CALLED TO, READ BACK BY AND VERIFIED WITH: Tobie Lords @ [id number] 12/23/16 by [place]    Klebsiella oxytoca NOT DETECTED NOT DETECTED Final   Klebsiella pneumoniae NOT DETECTED NOT DETECTED Final   Proteus species NOT DETECTED NOT DETECTED Final   Serratia marcescens NOT DETECTED NOT DETECTED Final   Carbapenem resistance NOT DETECTED NOT DETECTED Final   Haemophilus influenzae NOT DETECTED NOT DETECTED Final   Neisseria meningitidis NOT DETECTED NOT DETECTED Final   Pseudomonas aeruginosa NOT DETECTED NOT DETECTED Final   Candida albicans NOT DETECTED NOT DETECTED Final   Candida glabrata NOT DETECTED NOT DETECTED Final   Candida krusei NOT DETECTED NOT DETECTED Final   Candida parapsilosis NOT DETECTED NOT DETECTED Final   Candida tropicalis NOT DETECTED NOT DETECTED Final  CULTURE, BLOOD (ROUTINE X 2) w Reflex to ID Panel     Status: None   Collection Time: 12/25/16  1:11 PM  Result Value Ref Range Status   Specimen Description BLOOD RIGHT ASSIST CONTROL  Final   Special Requests   Final    BOTTLES DRAWN AEROBIC AND ANAEROBIC AER11ML ANA11ML   Culture NO GROWTH 5 DAYS  Final   Report Status 12/30/2016 FINAL  Final  CULTURE, BLOOD (ROUTINE X 2) w Reflex to ID Panel     Status: None   Collection Time: 12/25/16  1:54 PM  Result Value Ref Range Status  Specimen Description BLOOD LEFT HAND  Final   Special Requests BOTTLES DRAWN AEROBIC AND ANAEROBIC AER8ML ANA5ML  Final   Culture NO GROWTH 5 DAYS  Final   Report Status 12/30/2016 FINAL  Final    RADIOLOGY:  No results found.  Follow up with PCP in 1 week.  Management plans discussed  with the patient, family and they are in agreement.  CODE STATUS:     Code Status Orders        Start     Ordered   12/22/16 2349  Do not attempt resuscitation (DNR)  Continuous    Question Answer Comment  In the event of cardiac or respiratory ARREST Do not call a "code blue"   In the event of cardiac or respiratory ARREST Do not perform Intubation, CPR, defibrillation or ACLS   In the event of cardiac or respiratory ARREST Use medication by any route, position, wound care, and other measures to relive pain and suffering. May use oxygen, suction and manual treatment of airway obstruction as needed for comfort.      12/22/16 2348    Code Status History    Date Active Date Inactive Code Status Order ID Comments User Context   This patient has a current code status but no historical code status.    Advance Directive Documentation   Flowsheet Row Most Recent Value  Type of Advance Directive  Healthcare Power of Attorney  Pre-existing out of facility DNR order (yellow form or pink MOST form)  No data  "MOST" Form in Place?  No data      TOTAL TIME TAKING CARE OF THIS PATIENT ON DAY OF DISCHARGE: more than 30 minutes.   Hillary Bow R M.D on 12/30/2016 at 12:17 PM  Between 7am to 6pm - Pager - [phone]  After 6pm go to [email or web address] - password [email or web address] [facility]  Office  [phone]  CC: Primary care physician; Rusty Aus, MD  Note: This dictation was prepared with Dragon dictation along with smaller phrase technology. Any transcriptional errors that result from this process are unintentional.

## 2016-12-30 NOTE — Telephone Encounter (Signed)
App made and mailed to patient 

## 2016-12-30 NOTE — Discharge Instructions (Addendum)
Dysphagia 1 (Pureed) diet and Thin liquids  Activity as tolerated  Speech therapy to follow patient for dysphagia

## 2016-12-30 NOTE — Care Management (Signed)
Patient had left urethral stent placed 1.19.  Re consulted physical therapy.

## 2016-12-30 NOTE — Progress Notes (Signed)
Urology Consult Follow Up  Subjective: MAXIMUM TEMPERATURE 100.3, fever curve improving.  More alert and conversant than prior to stent.    Anti-infectives: Anti-infectives    Start     Dose/Rate Route Frequency Ordered Stop   12/27/16 1600  ciprofloxacin (CIPRO) IVPB 400 mg     400 mg 200 mL/hr over 60 Minutes Intravenous Every 12 hours 12/27/16 1443     12/23/16 1000  meropenem (MERREM) 1 g in sodium chloride 0.9 % 100 mL IVPB  Status:  Discontinued     1 g 200 mL/hr over 30 Minutes Intravenous Every 12 hours 12/23/16 0859 12/23/16 0911   12/23/16 1000  meropenem (MERREM) IVPB SOLR 1 g  Status:  Discontinued     1 g 100 mL/hr over 30 Minutes Intravenous Every 12 hours 12/23/16 0911 12/27/16 1443   12/23/16 0500  vancomycin (VANCOCIN) IVPB 750 mg/150 ml premix  Status:  Discontinued     750 mg 150 mL/hr over 60 Minutes Intravenous Every 18 hours 12/23/16 0018 12/23/16 0858   12/23/16 0200  piperacillin-tazobactam (ZOSYN) IVPB 3.375 g  Status:  Discontinued     3.375 g 12.5 mL/hr over 240 Minutes Intravenous Every 8 hours 12/23/16 0000 12/23/16 0858   12/22/16 1945  vancomycin (VANCOCIN) IVPB 1000 mg/200 mL premix     1,000 mg 200 mL/hr over 60 Minutes Intravenous  Once 12/22/16 1938 12/22/16 2113   12/22/16 1945  piperacillin-tazobactam (ZOSYN) IVPB 3.375 g     3.375 g 100 mL/hr over 30 Minutes Intravenous  Once 12/22/16 1938 12/22/16 2113   12/22/16 1930  cefTRIAXone (ROCEPHIN) 1 g in dextrose 5 % 50 mL IVPB  Status:  Discontinued     1 g 100 mL/hr over 30 Minutes Intravenous  Once 12/22/16 1922 12/22/16 2146   12/22/16 1900  cefTRIAXone (ROCEPHIN) 1 g in dextrose 5 % 50 mL IVPB  Status:  Discontinued     1 g 100 mL/hr over 30 Minutes Intravenous  Once 12/22/16 1854 12/22/16 1922      Current Facility-Administered Medications  Medication Dose Route Frequency Provider Last Rate Last Dose  . acetaminophen (TYLENOL) tablet 650 mg  650 mg Oral Q6H PRN Lance Coon, MD       Or   . acetaminophen (TYLENOL) suppository 650 mg  650 mg Rectal Q6H PRN Lance Coon, MD   650 mg at 12/28/16 1449  . benzonatate (TESSALON) capsule 200 mg  200 mg Oral TID PRN Lance Coon, MD   200 mg at 12/25/16 1050  . ciprofloxacin (CIPRO) IVPB 400 mg  400 mg Intravenous Q12H Srikar Sudini, MD   400 mg at 12/30/16 0400  . donepezil (ARICEPT) tablet 5 mg  5 mg Oral QHS Lance Coon, MD   5 mg at 12/29/16 2149  . enoxaparin (LOVENOX) injection 40 mg  40 mg Subcutaneous Q24H Lance Coon, MD   40 mg at 12/30/16 0000  . iopamidol (ISOVUE-300) 61 % injection 30 mL  30 mL Oral Once Srikar Sudini, MD      . ipratropium-albuterol (DUONEB) 0.5-2.5 (3) MG/3ML nebulizer solution 3 mL  3 mL Nebulization Q4H PRN Lance Coon, MD      . MEDLINE mouth rinse  15 mL Mouth Rinse BID Lance Coon, MD   15 mL at 12/29/16 2149  . metoprolol succinate (TOPROL-XL) 24 hr tablet 25 mg  25 mg Oral Daily Lance Coon, MD   25 mg at 12/30/16 0904  . ondansetron (ZOFRAN) tablet 4 mg  4 mg Oral  Q6H PRN Lance Coon, MD       Or  . ondansetron [facility]) injection 4 mg  4 mg Intravenous Q6H PRN Lance Coon, MD      . QUEtiapine (SEROQUEL) tablet 25 mg  25 mg Oral QHS Lance Coon, MD   25 mg at 12/29/16 2149  . senna-docusate (Senokot-S) tablet 2 tablet  2 tablet Oral BID Hillary Bow, MD   2 tablet at 12/30/16 0904     Objective: Vital signs in last 24 hours: Temp:  [98 F (36.7 C)-100.3 F (37.9 C)] 99.1 F (37.3 C) (01/19 0600) Pulse Rate:  [80-82] 80 (01/19 0345) Resp:  [18-22] 22 (01/19 0345) BP: (126-142)/(40-52) 142/52 (01/19 0345) SpO2:  [95 %-97 %] 97 % (01/19 0345)  Intake/Output from previous day: 01/18 0701 - 01/19 0700 In: 200 [IV Piggyback:200] Out: -  Intake/Output this shift: No intake/output data recorded.   Physical Exam  Alert and talkative, answers questions but not appropriately No respiratory distress Abdomen, soft, nontender nondistended   Lab Results:  No results for input(s):  WBC, HGB, HCT, PLT in the last 72 hours. BMET  Recent Labs  12/29/16 0709  CREATININE 0.65   PT/INR No results for input(s): LABPROT, INR in the last 72 hours. ABG No results for input(s): PHART, HCO3 in the last 72 hours.  Invalid input(s): PCO2, PO2  Studies/Results: Ct Abdomen Pelvis W Contrast  Result Date: 12/28/2016 CLINICAL DATA:  Altered mental status, fever EXAM: CT ABDOMEN AND PELVIS WITH CONTRAST TECHNIQUE: Multidetector CT imaging of the abdomen and pelvis was performed using the standard protocol following bolus administration of intravenous contrast. CONTRAST:  53mL ISOVUE-300 IOPAMIDOL (ISOVUE-300) INJECTION 61% COMPARISON:  CT scan 09/02/2010 FINDINGS: Lower chest: Cardiomegaly is noted. Small pericardial effusion measures 1 cm maximum thickness posteriorly. Atherosclerotic calcifications of mitral valve. Moderate size hiatal hernia measures 6.5 cm. Bilateral small pleural effusion. Bilateral basilar posterior atelectasis or infiltrate. Probable calcified granuloma right base posteriorly measures 8 mm. Hepatobiliary: No focal hepatic mass. Gallbladder is contracted without evidence of calcified gallstones. Pancreas: Enhanced pancreas is mild atrophic without focal abnormality. Spleen: Enhanced spleen is normal. Adrenals/Urinary Tract: Stable 9 mm left adrenal gland nodule. The right adrenal is unremarkable. There is subtle less enhancement of the left kidney. Delay images shows mild delay excretion of the left ureter. Findings suspicious for mild obstructive uropathy. There is nonobstructive calcified calculus in midpole of the right kidney measures 7 mm. A cyst in upper pole of the right kidney measures 3 cm. Nonobstructive calcified calculus in lower pole of the left kidney measures 7 mm. Axial image 21 there is calcified obstructive calculus in proximal left UPJ measures 10.8 mm. Minimal left hydronephrosis. There is a probable cyst mid pole of the left kidney posterior aspect  measures 1.5 cm. The urinary bladder is unremarkable. Stomach/Bowel: There is no small bowel obstruction. Minimal gaseous distended distal small bowel loops within pelvis probable minimal ileus. There is no transition in caliber of small bowel. No pericecal inflammation. Partially visualized normal appendix axial image 65. Moderate stool noted within descending colon. There is redundant sigmoid colon with moderate stool. Moderate stool and gas noted within rectum. The rectum measures 6.4 cm in diameter suspicious for mild fecal impaction. Mild thickening of anal wall. Proctitis cannot be excluded. Please see axial image 85. Clinical correlation is necessary. Vascular/Lymphatic: No aortic aneurysm. Atherosclerotic calcifications are noted abdominal aorta, SMA, celiac trunk origin and bilateral iliac arteries. There is no adenopathy. No inguinal adenopathy is noted. Reproductive:  The patient is status post hysterectomy. No ascites or free abdominal air. Other: Musculoskeletal: Significant dextroscoliosis lumbar spine. Extensive degenerative changes are noted lumbar spine. Moderate degenerative changes thoracic spine. There is about 5 mm anterolisthesis L4 on L5 vertebral body. Significant disc space flattening with vacuum disc phenomenon multilevel lumbar spine. Multilevel facet degenerative changes are noted lumbar spine. IMPRESSION: 1. There is 10.8 mm calcified obstructive calculus in left UPJ. Minimal left hydronephrosis. Bilateral nonobstructive nephrolithiasis. Bilateral renal cysts. 2. Subtle mild decreased enhancement and delay excretion of the left kidney probable mild obstructive uropathy. 3. Normal appendix.  No pericecal inflammation. 4. Minimal gaseous distension of distal small bowel probable mild ileus. No evidence of small bowel obstruction. 5. Moderate stool noted in distal colon. Moderate stool and gas noted within rectum suspicious for mild fecal impaction. There is thickening of anal wall.  Proctitis or inflammatory/neoplastic process cannot be excluded. Clinical correlation is necessary. 6. Status post hysterectomy. 7. Extensive degenerative changes lumbar spine. There is about 5 mm anterolisthesis L4 on L5 vertebral body. Significant levoscoliosis lumbar spine. 8. Bilateral small pleural effusion. Bilateral basilar posterior atelectasis or infiltrate. 9. Moderate size hiatal hernia. 10. Small pericardial effusion measures 1 cm maximum thickness posteriorly. Electronically Signed   By: Lahoma Crocker M.D.   On: 12/28/2016 10:52     Assessment/plan: 83 year old female with Escherichia coli bacteremia, left 11 mm UPJ stone postoperative 2 status post left ureteral stent placement.  Improving, fever curve trending down  On IV Cipro  Urology will sign off, outpatient follow-up arranged for definitive management of the stone     LOS: 8 days    Hollice Espy 12/30/2016

## 2016-12-30 NOTE — Progress Notes (Addendum)
Called report to the SNF nurse at this time. All questions answered. [facility] for non-emergent patient transport to SNF at this time. [facility]

## 2016-12-30 NOTE — Progress Notes (Addendum)
[facility] NAME: Denise Matthews    MR#:  [id number]  DATE OF BIRTH:  11/22/1920  SUBJECTIVE:  CHIEF COMPLAINT:   Chief Complaint  Patient presents with  . Altered Mental Status   Afebrile today,  REVIEW OF SYSTEMS:   Review of Systems  Unable to perform ROS: Dementia    DRUG ALLERGIES:   Allergies  Allergen Reactions  . Cholecalciferol Other (See Comments)  . Nitrofurantoin Macrocrystal Itching and Rash    VITALS:  Blood pressure 113/68, pulse 83, temperature 98.6 F (37 C), temperature source Oral, resp. rate 20, height 5\' 5"  (1.651 m), weight 63.5 kg (140 lb), SpO2 100 %.  PHYSICAL EXAMINATION:  GENERAL:  83 y.o.-year-old patient lying in the bed with no acute distress. Demented and hard of hearing. EYES: Pupils equal, round, reactive to light and accommodation. No scleral icterus. Extraocular muscles intact.  HEENT: Head atraumatic, normocephalic. Oropharynx and nasopharynx clear.  NECK:  Supple, no jugular venous distention. No thyroid enlargement, no tenderness.  LUNGS: Normal breath sounds bilaterally, no wheezing, rales,rhonchi or crepitation. No use of accessory muscles of respiration.  CARDIOVASCULAR: S1, S2 normal. No murmurs, rubs, or gallops.  ABDOMEN: Soft, nontender, nondistended. Bowel sounds present. No organomegaly or mass.  EXTREMITIES: No pedal edema, cyanosis, or clubbing.  NEUROLOGIC: unAble to do full neurological exam because of dementia.  PSYCHIATRIC: The patient is awake but Confused.   LABORATORY PANEL:   CBC  Recent Labs Lab 12/25/16 0606  WBC 11.0  HGB 10.2*  HCT 30.1*  PLT 169   ------------------------------------------------------------------------------------------------------------------  Chemistries   Recent Labs Lab 12/27/16 0429 12/29/16 0709  NA 143  --   K 3.5  --   CL 111  --   CO2 27  --   GLUCOSE 96  --   BUN 15  --   CREATININE 0.62 0.65  CALCIUM  8.1*  --    ------------------------------------------------------------------------------------------------------------------  Cardiac Enzymes No results for input(s): TROPONINI in the last 168 hours. ------------------------------------------------------------------------------------------------------------------  RADIOLOGY:  No results found.  EKG:   Orders placed or performed during the hospital encounter of 12/22/16  . EKG 12-Lead  . 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 Acute encephalopathy is improving. Sepsis  No further fever  IV ciprofloxacin. Switch to PO on discharge Now afebrile  * dementia  SNF at discharge  All the records are reviewed and case discussed with Care Management/Social Workerr. Management plans discussed with the patient, family and they are in agreement.  CODE STATUS: DNR  TOTAL TIME TAKING CARE OF THIS PATIENT: 35 minutes.    Hillary Bow R M.D on 12/30/2016 at 2:24 PM  Between 7am to 6pm - Pager - [phone]  After 6pm go to [email or web address] - password [email or web address] [facility]  Office  [phone]  CC: Primary care physician; Rusty Aus, MD   Note: This dictation was prepared with Dragon dictation along with smaller phrase technology. Any transcriptional errors that result from this process are unintentional.

## 2017-01-03 DIAGNOSIS — N200 Calculus of kidney: Secondary | ICD-10-CM | POA: Diagnosis not present

## 2017-01-03 DIAGNOSIS — I10 Essential (primary) hypertension: Secondary | ICD-10-CM | POA: Diagnosis not present

## 2017-01-03 DIAGNOSIS — D519 Vitamin B12 deficiency anemia, unspecified: Secondary | ICD-10-CM | POA: Diagnosis not present

## 2017-01-03 DIAGNOSIS — N390 Urinary tract infection, site not specified: Secondary | ICD-10-CM | POA: Diagnosis not present

## 2017-01-03 DIAGNOSIS — F0390 Unspecified dementia without behavioral disturbance: Secondary | ICD-10-CM | POA: Diagnosis not present

## 2017-01-05 NOTE — Progress Notes (Signed)
   12/24/16 1056  PT Time Calculation  PT Start Time (ACUTE ONLY) 1008  PT Stop Time (ACUTE ONLY) 1032  PT Time Calculation (min) (ACUTE ONLY) 24 min  PT G-Codes **NOT FOR INPATIENT CLASS**  Functional Assessment Tool Used clinical judgement  Functional Limitation Mobility: Walking and moving around  Mobility: Walking and Moving Around Current Status [id number]) CL  Mobility: Walking and Moving Around Goal Status [id number]) CJ  PT General Charges  $$ ACUTE PT VISIT 1 Procedure  PT Evaluation  $PT Eval Low Complexity 1 Procedure  PT Treatments  $Therapeutic Activity 8-22 mins    Late entry g-codes added after review of initial evaluation completed 12/24/16.  Ricki Clack H. Owens Shark, PT, DPT, NCS 01/05/17, 9:32 AM [phone]

## 2017-01-05 NOTE — Progress Notes (Signed)
   12/24/16 1744  Acute Rehab OT Goals  Patient Stated Goal to get stronger  OT Goal Formulation With family  Time For Goal Achievement 01/07/17  Potential to Achieve Goals Good  OT Time Calculation  OT Start Time (ACUTE ONLY) 1436  OT Stop Time (ACUTE ONLY) 1516  OT Time Calculation (min) 40 min  OT G-codes **NOT FOR INPATIENT CLASS**  Functional Assessment Tool Used clinical judgment  Functional Limitation Self care  Self Care Current Status [id number]) CM  Self Care Goal Status [id number]) CL  Self Care Discharge Status [id number]) CM  OT General Charges  $OT Visit 1 Procedure  OT Evaluation  $OT Eval Low Complexity 1 Procedure  OT Treatments  $Therapeutic Activity 23-37 mins   Late entry G-codes entered by Jeni Salles, OTR/L, after chart review. Assessment performed and documented by Jeni Salles, OTR/L.   Jeni Salles, OTR/L

## 2017-01-06 NOTE — Progress Notes (Signed)
Late entry for missed G-code. Based on review of the evaluation and goals by Orinda Kenner, [place], [place]

## 2017-01-11 DIAGNOSIS — F0390 Unspecified dementia without behavioral disturbance: Secondary | ICD-10-CM | POA: Diagnosis not present

## 2017-01-11 DIAGNOSIS — M6281 Muscle weakness (generalized): Secondary | ICD-10-CM | POA: Diagnosis not present

## 2017-01-11 DIAGNOSIS — N390 Urinary tract infection, site not specified: Secondary | ICD-10-CM | POA: Diagnosis not present

## 2017-01-11 DIAGNOSIS — I10 Essential (primary) hypertension: Secondary | ICD-10-CM | POA: Diagnosis not present

## 2017-01-11 DIAGNOSIS — D519 Vitamin B12 deficiency anemia, unspecified: Secondary | ICD-10-CM | POA: Diagnosis not present

## 2017-01-12 DIAGNOSIS — M6281 Muscle weakness (generalized): Secondary | ICD-10-CM | POA: Diagnosis not present

## 2017-01-12 DIAGNOSIS — I10 Essential (primary) hypertension: Secondary | ICD-10-CM | POA: Diagnosis not present

## 2017-01-12 DIAGNOSIS — A419 Sepsis, unspecified organism: Secondary | ICD-10-CM | POA: Diagnosis not present

## 2017-01-12 DIAGNOSIS — D510 Vitamin B12 deficiency anemia due to intrinsic factor deficiency: Secondary | ICD-10-CM | POA: Diagnosis not present

## 2017-01-12 DIAGNOSIS — N390 Urinary tract infection, site not specified: Secondary | ICD-10-CM | POA: Diagnosis not present

## 2017-01-12 DIAGNOSIS — R41841 Cognitive communication deficit: Secondary | ICD-10-CM | POA: Diagnosis not present

## 2017-01-12 DIAGNOSIS — Z5189 Encounter for other specified aftercare: Secondary | ICD-10-CM | POA: Diagnosis not present

## 2017-01-12 DIAGNOSIS — F0150 Vascular dementia without behavioral disturbance: Secondary | ICD-10-CM | POA: Diagnosis not present

## 2017-01-12 DIAGNOSIS — Z789 Other specified health status: Secondary | ICD-10-CM | POA: Diagnosis not present

## 2017-01-12 DIAGNOSIS — R1312 Dysphagia, oropharyngeal phase: Secondary | ICD-10-CM | POA: Diagnosis not present

## 2017-01-17 ENCOUNTER — Other Ambulatory Visit: Payer: Self-pay | Admitting: Radiology

## 2017-01-17 ENCOUNTER — Ambulatory Visit: Payer: PPO | Admitting: Urology

## 2017-01-17 ENCOUNTER — Other Ambulatory Visit: Payer: Self-pay | Admitting: *Deleted

## 2017-01-17 VITALS — BP 139/75 | HR 89 | Ht 64.0 in

## 2017-01-17 DIAGNOSIS — N201 Calculus of ureter: Secondary | ICD-10-CM

## 2017-01-17 NOTE — Progress Notes (Signed)
In and Out Catheterization  Patient is present today for a I & O catheterization due to Nephrolithiasis. Patient was cleaned and prepped in a sterile fashion with betadine and Lidocaine 2% jelly was instilled into the urethra.  A 14 FR cath was inserted no complications were noted , 20 ml of urine return was noted, urine was dark brown in color. A clean urine sample was collected for urine culture. Bladder was drained  And catheter was removed with out difficulty.    Preformed by: Elberta Leatherwood, MD / K.Delroy Ordway,CMA

## 2017-01-17 NOTE — Patient Outreach (Signed)
[name]) Care Management  01/17/2017  Denise Matthews 03/23/1920 [id number]   Met with patient and her daughter [name] at facility.  She reports that she has been caring for patient since 2012.  She thinks her mom will need [facility] care management services when she discharges. SHe reports patient doing very well with therapy, she hopes patient can stay several more weeks.   RNCM left brochure and card and will follow for discharge needs.  Royetta Crochet. Laymond Purser, RN, BSN, [place] [phone]) Business Cell  [phone]) Toll Free Office

## 2017-01-17 NOTE — Progress Notes (Signed)
01/17/2017 3:19 PM   Denise Matthews 08-19-1920 [id number]  Referring provider: Rusty Aus, MD [facility] [place], [place] [address]  Chief Complaint  Patient presents with  . Nephrolithiasis    HPI: F/u urgent stent On the left side January 18 for an 11 mm left UPJ stone (? Equivocal on scout, HU 1200, SSD 7.8 cm) and sepsis. There is also an 8 mm left lower pole stone. Urine culture grew mixed growth, blood grew Escherichia coli resistant to cefazolin but sensitive to Bactrim, Cipro and cefepime.   PMH: Past Medical History:  Diagnosis Date  . Arthritis   . Atrial fibrillation ([place])   . Dementia   . Heartburn   . HTN (hypertension)   . Recurrent UTI   . Sleep apnea   . Vaginal atrophy     Surgical History: Past Surgical History:  Procedure Laterality Date  . ABDOMINAL HYSTERECTOMY    . CATARACT EXTRACTION    . CYSTOSCOPY WITH STENT PLACEMENT Left 12/28/2016   Procedure: CYSTOSCOPY WITH STENT PLACEMENT;  Surgeon: Hollice Espy, MD;  Location: [facility] ORS;  Service: Urology;  Laterality: Left;  . ROTATOR CUFF REPAIR    . vaginal sling      Home Medications:  Allergies as of 01/17/2017      Reactions   Cholecalciferol Other (See Comments)   Nitrofurantoin Macrocrystal Itching, Rash      Medication List       Accurate as of 01/17/17  3:19 PM. Always use your most recent med list.          acetaminophen 500 MG tablet Commonly known as:  TYLENOL Take by mouth.   CALCIUM 500/D 500-200 MG-UNIT tablet Generic drug:  calcium-vitamin D Take 1 tablet by mouth 2 (two) times daily.   ciprofloxacin 500 MG tablet Commonly known as:  CIPRO Take 1 tablet (500 mg total) by mouth 2 (two) times daily.   conjugated estrogens vaginal cream Commonly known as:  PREMARIN Place vaginally daily. Apply 0.5mg  (pea-sized amount)  just inside the vaginal introitus with a finger-tip every night for two weeks and then Monday, Wednesday and  Friday nights.   D-MANNOSE PO Take 1 capsule by mouth 2 (two) times daily.   donepezil 5 MG tablet Commonly known as:  ARICEPT Take 5 mg by mouth at bedtime.   metoprolol succinate 50 MG 24 hr tablet Commonly known as:  TOPROL-XL Take 25 mg by mouth daily.   PRESERVISION AREDS PO Take 1 capsule by mouth 2 (two) times daily.   QUEtiapine 25 MG tablet Commonly known as:  SEROQUEL Take 25 mg by mouth at bedtime.   RA VITAMIN B-12 TR 1000 MCG Tbcr Generic drug:  Cyanocobalamin Take 1 tablet by mouth daily.   sennosides-docusate sodium 8.6-50 MG tablet Commonly known as:  SENOKOT-S Take 2 tablets by mouth 2 (two) times daily.   traMADol-acetaminophen 37.5-325 MG tablet Commonly known as:  ULTRACET Take 1 tablet by mouth every 6 (six) hours as needed for severe pain.       Allergies:  Allergies  Allergen Reactions  . Cholecalciferol Other (See Comments)  . Nitrofurantoin Macrocrystal Itching and Rash    Family History: Family History  Problem Relation Age of Onset  . Heart disease Father   . Stroke Father   . Cancer Brother   . Kidney disease Neg Hx   . Bladder Cancer Neg Hx     Social History:  reports that she has never  smoked. She has never used smokeless tobacco. She reports that she does not drink alcohol or use drugs.  ROS:                                        Physical Exam: BP 139/75   Pulse 89   Ht 5\' 4"  (1.626 m)   Constitutional:  Alert and oriented, No acute distress. HEENT: [place] AT, moist mucus membranes.  Trachea midline, no masses. Cardiovascular: No clubbing, cyanosis, or edema. Respiratory: Normal respiratory effort, no increased work of breathing. GI: Abdomen is soft, nontender, nondistended, no abdominal masses GU: No CVA tenderness Skin: No rashes, bruises or suspicious lesions. Lymph: No cervical or inguinal adenopathy. Neurologic: Grossly intact, no focal deficits, moving all 4 extremities. Psychiatric:  Normal mood and affect.  Laboratory Data: Lab Results  Component Value Date   WBC 11.0 12/25/2016   HGB 10.2 (L) 12/25/2016   HCT 30.1 (L) 12/25/2016   MCV 86.2 12/25/2016   PLT 169 12/25/2016    Lab Results  Component Value Date   CREATININE 0.65 12/29/2016    No results found for: PSA  No results found for: TESTOSTERONE  No results found for: HGBA1C  Urinalysis    Component Value Date/Time   COLORURINE YELLOW (A) 12/22/2016 1901   APPEARANCEUR TURBID (A) 12/22/2016 1901   APPEARANCEUR Cloudy (A) 08/16/2016 1337   LABSPEC 1.014 12/22/2016 1901   LABSPEC 1.017 10/19/2014 1916   PHURINE 5.0 12/22/2016 1901   GLUCOSEU NEGATIVE 12/22/2016 1901   GLUCOSEU Negative 10/19/2014 1916   HGBUR NEGATIVE 12/22/2016 1901   BILIRUBINUR NEGATIVE 12/22/2016 1901   BILIRUBINUR Negative 08/16/2016 1337   BILIRUBINUR Negative 10/19/2014 1916   KETONESUR NEGATIVE 12/22/2016 1901   PROTEINUR 30 (A) 12/22/2016 1901   NITRITE NEGATIVE 12/22/2016 1901   LEUKOCYTESUR NEGATIVE 12/22/2016 1901   LEUKOCYTESUR 3+ (A) 08/16/2016 1337   LEUKOCYTESUR Trace 10/19/2014 1916    Pertinent Imaging: CT  Assessment & Plan:    Left UPJ stone- She has apnea, A. fib and frailty and is not a good candidate for a percutaneous approach or shockwave. We discussed simply removing the stent or stent changes but she'll probably best be served by ureteroscopy although we discussed she may need a staged procedure and there will be significant risks of infection, bleeding and other cardiovascular issues. Cathed U/A sent and then I start on a night prophylactic dose given she'll be at high risk for UTI with chronic bacteriuria, stent, stone, etc.   There are no diagnoses linked to this encounter.  No Follow-up on file.  Festus Aloe, [facility] [address], [place], [place] [address] [phone]

## 2017-01-19 ENCOUNTER — Encounter
Admission: RE | Admit: 2017-01-19 | Discharge: 2017-01-19 | Disposition: A | Discharge status: Home / Self Care | Payer: PPO | Source: Ambulatory Visit | Attending: Urology | Admitting: Urology

## 2017-01-19 DIAGNOSIS — Z823 Family history of stroke: Secondary | ICD-10-CM | POA: Diagnosis not present

## 2017-01-19 DIAGNOSIS — Z01812 Encounter for preprocedural laboratory examination: Secondary | ICD-10-CM | POA: Diagnosis not present

## 2017-01-19 DIAGNOSIS — Z90710 Acquired absence of both cervix and uterus: Secondary | ICD-10-CM | POA: Insufficient documentation

## 2017-01-19 DIAGNOSIS — Z888 Allergy status to other drugs, medicaments and biological substances status: Secondary | ICD-10-CM | POA: Diagnosis not present

## 2017-01-19 DIAGNOSIS — I10 Essential (primary) hypertension: Secondary | ICD-10-CM | POA: Diagnosis not present

## 2017-01-19 DIAGNOSIS — Z8052 Family history of malignant neoplasm of bladder: Secondary | ICD-10-CM | POA: Diagnosis not present

## 2017-01-19 DIAGNOSIS — Z9889 Other specified postprocedural states: Secondary | ICD-10-CM | POA: Insufficient documentation

## 2017-01-19 DIAGNOSIS — Z79899 Other long term (current) drug therapy: Secondary | ICD-10-CM | POA: Insufficient documentation

## 2017-01-19 DIAGNOSIS — N202 Calculus of kidney with calculus of ureter: Secondary | ICD-10-CM | POA: Insufficient documentation

## 2017-01-19 DIAGNOSIS — M25562 Pain in left knee: Secondary | ICD-10-CM | POA: Diagnosis not present

## 2017-01-19 DIAGNOSIS — Z8249 Family history of ischemic heart disease and other diseases of the circulatory system: Secondary | ICD-10-CM | POA: Diagnosis not present

## 2017-01-19 DIAGNOSIS — F0390 Unspecified dementia without behavioral disturbance: Secondary | ICD-10-CM | POA: Diagnosis not present

## 2017-01-19 DIAGNOSIS — D519 Vitamin B12 deficiency anemia, unspecified: Secondary | ICD-10-CM | POA: Diagnosis not present

## 2017-01-19 DIAGNOSIS — N200 Calculus of kidney: Secondary | ICD-10-CM | POA: Diagnosis not present

## 2017-01-19 DIAGNOSIS — M25561 Pain in right knee: Secondary | ICD-10-CM | POA: Diagnosis not present

## 2017-01-19 HISTORY — DX: Dysphagia, unspecified: R13.10

## 2017-01-19 HISTORY — DX: Pneumonia, unspecified organism: J18.9

## 2017-01-19 HISTORY — DX: Gastro-esophageal reflux disease without esophagitis: K21.9

## 2017-01-19 HISTORY — DX: Anemia, unspecified: D64.9

## 2017-01-19 HISTORY — DX: Personal history of urinary calculi: Z87.442

## 2017-01-19 HISTORY — DX: Sepsis, unspecified organism: A41.9

## 2017-01-19 LAB — CBC
HCT: 31 % — ABNORMAL LOW (ref 35.0–47.0)
Hemoglobin: 10.3 g/dL — ABNORMAL LOW (ref 12.0–16.0)
MCH: 28.4 pg (ref 26.0–34.0)
MCHC: 33.3 g/dL (ref 32.0–36.0)
MCV: 85.3 fL (ref 80.0–100.0)
Platelets: 256 10*3/uL (ref 150–440)
RBC: 3.64 MIL/uL — ABNORMAL LOW (ref 3.80–5.20)
RDW: 14.6 % — AB (ref 11.5–14.5)
WBC: 6.3 10*3/uL (ref 3.6–11.0)

## 2017-01-19 LAB — CULTURE, URINE COMPREHENSIVE

## 2017-01-19 NOTE — Patient Instructions (Addendum)
  Your procedure is scheduled on: January 24, 2017 (Tuesday) Report to Same Day Surgery 2nd floor medical mall [facility] Entrance-take elevator on left to 2nd floor.  Check in with surgery information desk.) To find out your arrival time please call [phone] between 1PM - 3PM on January 23, 2017 (Monday)   Remember: Instructions that are not followed completely may result in serious medical risk, up to and including death, or upon the discretion of your surgeon and anesthesiologist your surgery may need to be rescheduled.    _x___ 1. Do not eat food or drink liquids after midnight. No gum chewing or hard candies.     __x__ 2. No Alcohol for 24 hours before or after surgery.   __x__3. No Smoking for 24 prior to surgery.   ____  4. Bring all medications with you on the day of surgery if instructed.    __x__ 5. Notify your doctor if there is any change in your medical condition     (cold, fever, infections).     Do not wear jewelry, make-up, hairpins, clips or nail polish.  Do not wear lotions, powders, or perfumes. You may wear deodorant.  Do not shave 48 hours prior to surgery. Men may shave face and neck.  Do not bring valuables to the hospital.    [facility] is not responsible for any belongings or valuables.               Contacts, dentures or bridgework may not be worn into surgery.  Leave your suitcase in the car. After surgery it may be brought to your room.  For patients admitted to the hospital, discharge time is determined by your treatment team.   Patients discharged the day of surgery will not be allowed to drive home.  You will need someone to drive you home and stay with you the night of your procedure.    Please read over the following fact sheets that you were given:   [facility] Preparing for Surgery and or MRSA Information   _x___ Take these medicines the morning of surgery with A SIP OF WATER:    1. METOPROLOL  2.  3.  4.  5.  6.  ____Fleets  enema or Magnesium Citrate as directed.   ___ Use CHG Soap or sage wipes as directed on instruction sheet   ____ Use inhalers on the day of surgery and bring to hospital day of surgery  ____ Stop metformin 2 days prior to surgery    ____ Take 1/2 of usual insulin dose the night before surgery and none on the morning of           surgery.   ____ Stop Aspirin, Coumadin, Pllavix ,Eliquis, Effient, or Pradaxa  x__ Stop Anti-inflammatories such as Advil, Aleve, Ibuprofen, Motrin, Naproxen,          Naprosyn, Goodies powders or aspirin products. Ok to take Tylenol.   _x___ Stop supplements until after surgery.  (STOP VITAMIN B-12 and PRESERVISION  NOW)  ____ Bring C-Pap to the hospital.

## 2017-01-19 NOTE — Pre-Procedure Instructions (Signed)
Dr. Randa [facility]  made aware of EKG performed on December 22, 2016 and patient recent hospitalization (January 11-19, 2018) and stated ok for surgery.

## 2017-01-20 NOTE — Pre-Procedure Instructions (Signed)
Hemogram sent to Dr. Erlene Quan and Anesthesia for review.

## 2017-01-23 ENCOUNTER — Telehealth: Payer: Self-pay | Admitting: Radiology

## 2017-01-23 ENCOUNTER — Telehealth: Payer: Self-pay

## 2017-01-23 DIAGNOSIS — I10 Essential (primary) hypertension: Secondary | ICD-10-CM | POA: Diagnosis not present

## 2017-01-23 DIAGNOSIS — B3741 Candidal cystitis and urethritis: Secondary | ICD-10-CM

## 2017-01-23 DIAGNOSIS — L8990 Pressure ulcer of unspecified site, unspecified stage: Secondary | ICD-10-CM | POA: Diagnosis not present

## 2017-01-23 DIAGNOSIS — F0391 Unspecified dementia with behavioral disturbance: Secondary | ICD-10-CM | POA: Diagnosis not present

## 2017-01-23 DIAGNOSIS — N390 Urinary tract infection, site not specified: Secondary | ICD-10-CM | POA: Diagnosis not present

## 2017-01-23 DIAGNOSIS — I480 Paroxysmal atrial fibrillation: Secondary | ICD-10-CM | POA: Diagnosis not present

## 2017-01-23 DIAGNOSIS — N201 Calculus of ureter: Secondary | ICD-10-CM | POA: Diagnosis not present

## 2017-01-23 MED ORDER — SULFAMETHOXAZOLE-TRIMETHOPRIM 800-160 MG PO TABS: 1.0000 | ORAL_TABLET | Freq: Two times a day (BID) | ORAL | 0 refills | Status: AC

## 2017-01-23 MED ORDER — FLUCONAZOLE 200 MG PO TABS: 200.0000 mg | ORAL_TABLET | Freq: Every day | ORAL | 0 refills | Status: AC

## 2017-01-23 NOTE — Telephone Encounter (Signed)
Surgery is rescheduled to Wednesday. Orders and scripts faxed to [facility].

## 2017-01-23 NOTE — Telephone Encounter (Signed)
Notified pt's daughter, [name], that surgery has been postponed until 01/25/17 in order for pt to take abx prior to surgery as added precaution. Advised pt to call day prior to surgery for arrival time to SDS. Teena voices understanding.

## 2017-01-23 NOTE — Telephone Encounter (Signed)
Festus Aloe, MD  Lestine Box, LPN        Notify patient/daughter - urine culture positive for yeast   Start three days before Ureteroscopy (Amy should have date):   1) bactrim DS one po BID, #14, no refill (to treat prior culture)  2) fluconazole 200 mg po daily, #7 , no refill (to treat current culture)   Please send in abx and put this note in the chart. Thanks!    Due to pt scheduled to have surgery tomorrow, Amy, is in the process of figuring out what needs to be done. No medication has been called in at this point.

## 2017-01-23 NOTE — Pre-Procedure Instructions (Addendum)
CLEARED BY PCP. HIGHER RISK DUE TO AGE BUT NO HEART CONDITIONS

## 2017-01-24 ENCOUNTER — Telehealth: Payer: Self-pay

## 2017-01-24 NOTE — Telephone Encounter (Signed)
In light of her history, positive urine culture, low-grade temps, and overall frailty, lets plan to get more abx in her system and reschedule for next Monday.  Hollice Espy, MD

## 2017-01-24 NOTE — Telephone Encounter (Signed)
Pt daughter, [name], called stating pt only received a 1 abx pill last night at 9:10. [name] also stated that pt is currently running a low grade fever of 100.1. Teena voiced her biggest concern to be a low grade fever and pt age. Reinforced with [name] will make Amy and physician aware and once a decision is made someone will return her call. Teena voiced understanding.

## 2017-01-24 NOTE — Telephone Encounter (Signed)
Notified pt's daughter, [name], of plan to reschedule for 01/30/17. Teena states pt only had one elevated temp & it has returned to baseline. Requests surgery be done on 2/16. Advised that in light of the urine culture and low-grade temp, it would be best to wait until Monday so she can take more of the abx. Teena voices understanding.

## 2017-01-26 ENCOUNTER — Other Ambulatory Visit: Payer: Self-pay | Admitting: *Deleted

## 2017-01-26 DIAGNOSIS — I4891 Unspecified atrial fibrillation: Secondary | ICD-10-CM

## 2017-01-26 NOTE — Patient Outreach (Signed)
[facility]) Care Management  01/26/2017  Denise Matthews 14-Jan-1920 [id number]   Met with patient and daughter at facility. Daughter reports she has appealed the decision from [facility] plan to end therapy. Hoping patient will get to stay under [facility] for another week. RNCM reviewed services and difference in [facility] care management and home care.  Daughter confirms they want [facility] program services upon discharge.  Consent obtained.  Patient has lithotripsy procedure planned for next week, had to be postponed due to need for antibiotics and patient had ran a fever.   Daughter is going out of town for a week, she has arranged for patient to stay privately if needed until she returns.  Patient should not discharge before 02/09/17.  Plan to place referral for [facility] RNCM community. Facility SW, Dimple Nanas aware of [facility] involvement.  Royetta Crochet. Laymond Purser, RN, BSN, [place] [phone]) Business Cell  [phone]) Toll Free Office

## 2017-01-30 ENCOUNTER — Ambulatory Visit: Payer: PPO | Admitting: Anesthesiology

## 2017-01-30 ENCOUNTER — Encounter
Admission: RE | Disposition: A | Discharge status: Skilled Nursing Facility | Payer: Self-pay | Source: Ambulatory Visit | Attending: Urology

## 2017-01-30 ENCOUNTER — Ambulatory Visit
Admission: RE | Admit: 2017-01-30 | Discharge: 2017-01-30 | Disposition: A | Discharge status: Skilled Nursing Facility | Payer: PPO | Source: Ambulatory Visit | Attending: Urology | Admitting: Urology

## 2017-01-30 ENCOUNTER — Encounter: Payer: Self-pay | Admitting: *Deleted

## 2017-01-30 DIAGNOSIS — Z888 Allergy status to other drugs, medicaments and biological substances status: Secondary | ICD-10-CM | POA: Diagnosis not present

## 2017-01-30 DIAGNOSIS — M1990 Unspecified osteoarthritis, unspecified site: Secondary | ICD-10-CM | POA: Diagnosis not present

## 2017-01-30 DIAGNOSIS — K219 Gastro-esophageal reflux disease without esophagitis: Secondary | ICD-10-CM | POA: Insufficient documentation

## 2017-01-30 DIAGNOSIS — I10 Essential (primary) hypertension: Secondary | ICD-10-CM | POA: Diagnosis not present

## 2017-01-30 DIAGNOSIS — N202 Calculus of kidney with calculus of ureter: Secondary | ICD-10-CM | POA: Diagnosis not present

## 2017-01-30 DIAGNOSIS — D649 Anemia, unspecified: Secondary | ICD-10-CM | POA: Diagnosis not present

## 2017-01-30 DIAGNOSIS — I4891 Unspecified atrial fibrillation: Secondary | ICD-10-CM | POA: Insufficient documentation

## 2017-01-30 DIAGNOSIS — Z87440 Personal history of urinary (tract) infections: Secondary | ICD-10-CM | POA: Insufficient documentation

## 2017-01-30 DIAGNOSIS — F0390 Unspecified dementia without behavioral disturbance: Secondary | ICD-10-CM | POA: Insufficient documentation

## 2017-01-30 DIAGNOSIS — G4730 Sleep apnea, unspecified: Secondary | ICD-10-CM | POA: Insufficient documentation

## 2017-01-30 DIAGNOSIS — Z466 Encounter for fitting and adjustment of urinary device: Secondary | ICD-10-CM | POA: Diagnosis not present

## 2017-01-30 DIAGNOSIS — N201 Calculus of ureter: Secondary | ICD-10-CM

## 2017-01-30 HISTORY — PX: CYSTOSCOPY W/ URETERAL STENT PLACEMENT: SHX1429

## 2017-01-30 HISTORY — PX: URETEROSCOPY WITH HOLMIUM LASER LITHOTRIPSY: SHX6645

## 2017-01-30 SURGERY — URETEROSCOPY, WITH LITHOTRIPSY USING HOLMIUM LASER
Anesthesia: General | Laterality: Left | Wound class: Clean Contaminated

## 2017-01-30 MED ORDER — LABETALOL HCL 5 MG/ML IV SOLN
5.0000 mg | Freq: Once | INTRAVENOUS | Status: AC
  Administered 2017-01-30: 5 mg via INTRAVENOUS

## 2017-01-30 MED ORDER — FAMOTIDINE 20 MG PO TABS: 20.0000 mg | ORAL_TABLET | Freq: Once | ORAL | Status: DC

## 2017-01-30 MED ORDER — LIDOCAINE HCL (CARDIAC) 20 MG/ML IV SOLN
INTRAVENOUS | Status: DC | PRN
  Administered 2017-01-30: 30 mg via INTRAVENOUS

## 2017-01-30 MED ORDER — FENTANYL CITRATE (PF) 100 MCG/2ML IJ SOLN: 25.0000 ug | INTRAMUSCULAR | Status: DC | PRN

## 2017-01-30 MED ORDER — PROPOFOL 10 MG/ML IV BOLUS
INTRAVENOUS | Status: AC
  Filled 2017-01-30: qty 20

## 2017-01-30 MED ORDER — CEFTRIAXONE SODIUM 1 G IJ SOLR
1.0000 g | INTRAMUSCULAR | Status: AC
  Administered 2017-01-30: 2 g via INTRAVENOUS
  Filled 2017-01-30: qty 10

## 2017-01-30 MED ORDER — PHENYLEPHRINE HCL 10 MG/ML IJ SOLN
INTRAMUSCULAR | Status: DC | PRN
  Administered 2017-01-30: 50 ug via INTRAVENOUS
  Administered 2017-01-30: 100 ug via INTRAVENOUS

## 2017-01-30 MED ORDER — SODIUM CHLORIDE 0.9 % IV SOLN
INTRAVENOUS | Status: DC
  Administered 2017-01-30: 07:00:00 via INTRAVENOUS

## 2017-01-30 MED ORDER — SUCCINYLCHOLINE CHLORIDE 20 MG/ML IJ SOLN
INTRAMUSCULAR | Status: DC | PRN
  Administered 2017-01-30: 100 mg via INTRAVENOUS

## 2017-01-30 MED ORDER — IOTHALAMATE MEGLUMINE 43 % IV SOLN
INTRAVENOUS | Status: DC | PRN
  Administered 2017-01-30: 20 mL

## 2017-01-30 MED ORDER — PROPOFOL 10 MG/ML IV BOLUS
INTRAVENOUS | Status: DC | PRN
  Administered 2017-01-30: 100 mg via INTRAVENOUS

## 2017-01-30 MED ORDER — ONDANSETRON HCL 4 MG/2ML IJ SOLN: 4.0000 mg | Freq: Once | INTRAMUSCULAR | Status: DC | PRN

## 2017-01-30 MED ORDER — LACTATED RINGERS IV SOLN
INTRAVENOUS | Status: DC | PRN
  Administered 2017-01-30: 08:00:00 via INTRAVENOUS

## 2017-01-30 MED ORDER — LABETALOL HCL 5 MG/ML IV SOLN
INTRAVENOUS | Status: AC
  Filled 2017-01-30: qty 4

## 2017-01-30 SURGICAL SUPPLY — 30 items
BAG DRAIN CYSTO-URO LG1000N (MISCELLANEOUS) ×3 IMPLANT
BASKET ZERO TIP 1.9FR (BASKET) ×3 IMPLANT
CATH URETL 5X70 OPEN END (CATHETERS) ×3 IMPLANT
CNTNR SPEC 2.5X3XGRAD LEK (MISCELLANEOUS) ×1
CONRAY 43 FOR UROLOGY 50M (MISCELLANEOUS) ×3 IMPLANT
CONT SPEC 4OZ STER OR WHT (MISCELLANEOUS) ×2
CONTAINER SPEC 2.5X3XGRAD LEK (MISCELLANEOUS) ×1 IMPLANT
DRAPE UTILITY 15X26 TOWEL STRL (DRAPES) ×3 IMPLANT
FIBER LASER LITHO 273 (Laser) ×3 IMPLANT
GLOVE BIO SURGEON STRL SZ 6.5 (GLOVE) ×4 IMPLANT
GLOVE BIO SURGEONS STRL SZ 6.5 (GLOVE) ×2
GOWN STRL REUS W/ TWL LRG LVL3 (GOWN DISPOSABLE) ×2 IMPLANT
GOWN STRL REUS W/TWL LRG LVL3 (GOWN DISPOSABLE) ×4
GUIDEWIRE GREEN .038 145CM (MISCELLANEOUS) ×3 IMPLANT
INFUSOR MANOMETER BAG 3000ML (MISCELLANEOUS) ×3 IMPLANT
INTRODUCER DILATOR DOUBLE (INTRODUCER) ×3 IMPLANT
KIT RM TURNOVER CYSTO AR (KITS) ×3 IMPLANT
PACK CYSTO AR (MISCELLANEOUS) ×3 IMPLANT
SCRUB POVIDONE IODINE 4 OZ (MISCELLANEOUS) IMPLANT
SENSORWIRE 0.038 NOT ANGLED (WIRE) ×3
SET CYSTO W/LG BORE CLAMP LF (SET/KITS/TRAYS/PACK) ×3 IMPLANT
SHEATH URETERAL 12FRX35CM (MISCELLANEOUS) IMPLANT
SOL .9 NS 3000ML IRR  AL (IV SOLUTION) ×2
SOL .9 NS 3000ML IRR UROMATIC (IV SOLUTION) ×1 IMPLANT
STENT URET 6FRX24 CONTOUR (STENTS) ×3 IMPLANT
STENT URET 6FRX26 CONTOUR (STENTS) IMPLANT
SURGILUBE 2OZ TUBE FLIPTOP (MISCELLANEOUS) ×3 IMPLANT
SYRINGE IRR TOOMEY STRL 70CC (SYRINGE) ×3 IMPLANT
WATER STERILE IRR 1000ML POUR (IV SOLUTION) ×3 IMPLANT
WIRE SENSOR 0.038 NOT ANGLED (WIRE) ×1 IMPLANT

## 2017-01-30 NOTE — Interval H&P Note (Signed)
History and Physical Interval Note:  01/30/2017 7:29 AM  Denise Matthews  has presented today for surgery, with the diagnosis of left upj stone  The various methods of treatment have been discussed with the patient and family. After consideration of risks, benefits and other options for treatment, the patient has consented to  Procedure(s): URETEROSCOPY WITH HOLMIUM LASER LITHOTRIPSY (Left) CYSTOSCOPY WITH STENT REPLACEMENT (Left) as a surgical intervention .  The patient's history has been reviewed, patient examined, no change in status, stable for surgery.  I have reviewed the patient's chart and labs.  Questions were answered to the patient's satisfaction.    RRR CTAB  Currently being treated with abx and antifungals.    Hollice Espy

## 2017-01-30 NOTE — H&P (View-Only) (Signed)
01/17/2017 3:19 PM   Denise Matthews 1920-06-22 [id number]  Referring provider: Rusty Aus, MD [facility] [place], [place] [address]  Chief Complaint  Patient presents with  . Nephrolithiasis    HPI: F/u urgent stent On the left side January 18 for an 11 mm left UPJ stone (? Equivocal on scout, HU 1200, SSD 7.8 cm) and sepsis. There is also an 8 mm left lower pole stone. Urine culture grew mixed growth, blood grew Escherichia coli resistant to cefazolin but sensitive to Bactrim, Cipro and cefepime.   PMH: Past Medical History:  Diagnosis Date  . Arthritis   . Atrial fibrillation ([place])   . Dementia   . Heartburn   . HTN (hypertension)   . Recurrent UTI   . Sleep apnea   . Vaginal atrophy     Surgical History: Past Surgical History:  Procedure Laterality Date  . ABDOMINAL HYSTERECTOMY    . CATARACT EXTRACTION    . CYSTOSCOPY WITH STENT PLACEMENT Left 12/28/2016   Procedure: CYSTOSCOPY WITH STENT PLACEMENT;  Surgeon: Hollice Espy, MD;  Location: [facility] ORS;  Service: Urology;  Laterality: Left;  . ROTATOR CUFF REPAIR    . vaginal sling      Home Medications:  Allergies as of 01/17/2017      Reactions   Cholecalciferol Other (See Comments)   Nitrofurantoin Macrocrystal Itching, Rash      Medication List       Accurate as of 01/17/17  3:19 PM. Always use your most recent med list.          acetaminophen 500 MG tablet Commonly known as:  TYLENOL Take by mouth.   CALCIUM 500/D 500-200 MG-UNIT tablet Generic drug:  calcium-vitamin D Take 1 tablet by mouth 2 (two) times daily.   ciprofloxacin 500 MG tablet Commonly known as:  CIPRO Take 1 tablet (500 mg total) by mouth 2 (two) times daily.   conjugated estrogens vaginal cream Commonly known as:  PREMARIN Place vaginally daily. Apply 0.5mg  (pea-sized amount)  just inside the vaginal introitus with a finger-tip every night for two weeks and then Monday, Wednesday and  Friday nights.   D-MANNOSE PO Take 1 capsule by mouth 2 (two) times daily.   donepezil 5 MG tablet Commonly known as:  ARICEPT Take 5 mg by mouth at bedtime.   metoprolol succinate 50 MG 24 hr tablet Commonly known as:  TOPROL-XL Take 25 mg by mouth daily.   PRESERVISION AREDS PO Take 1 capsule by mouth 2 (two) times daily.   QUEtiapine 25 MG tablet Commonly known as:  SEROQUEL Take 25 mg by mouth at bedtime.   RA VITAMIN B-12 TR 1000 MCG Tbcr Generic drug:  Cyanocobalamin Take 1 tablet by mouth daily.   sennosides-docusate sodium 8.6-50 MG tablet Commonly known as:  SENOKOT-S Take 2 tablets by mouth 2 (two) times daily.   traMADol-acetaminophen 37.5-325 MG tablet Commonly known as:  ULTRACET Take 1 tablet by mouth every 6 (six) hours as needed for severe pain.       Allergies:  Allergies  Allergen Reactions  . Cholecalciferol Other (See Comments)  . Nitrofurantoin Macrocrystal Itching and Rash    Family History: Family History  Problem Relation Age of Onset  . Heart disease Father   . Stroke Father   . Cancer Brother   . Kidney disease Neg Hx   . Bladder Cancer Neg Hx     Social History:  reports that she has never  smoked. She has never used smokeless tobacco. She reports that she does not drink alcohol or use drugs.  ROS:                                        Physical Exam: BP 139/75   Pulse 89   Ht 5\' 4"  (1.626 m)   Constitutional:  Alert and oriented, No acute distress. HEENT: [place] AT, moist mucus membranes.  Trachea midline, no masses. Cardiovascular: No clubbing, cyanosis, or edema. Respiratory: Normal respiratory effort, no increased work of breathing. GI: Abdomen is soft, nontender, nondistended, no abdominal masses GU: No CVA tenderness Skin: No rashes, bruises or suspicious lesions. Lymph: No cervical or inguinal adenopathy. Neurologic: Grossly intact, no focal deficits, moving all 4 extremities. Psychiatric:  Normal mood and affect.  Laboratory Data: Lab Results  Component Value Date   WBC 11.0 12/25/2016   HGB 10.2 (L) 12/25/2016   HCT 30.1 (L) 12/25/2016   MCV 86.2 12/25/2016   PLT 169 12/25/2016    Lab Results  Component Value Date   CREATININE 0.65 12/29/2016    No results found for: PSA  No results found for: TESTOSTERONE  No results found for: HGBA1C  Urinalysis    Component Value Date/Time   COLORURINE YELLOW (A) 12/22/2016 1901   APPEARANCEUR TURBID (A) 12/22/2016 1901   APPEARANCEUR Cloudy (A) 08/16/2016 1337   LABSPEC 1.014 12/22/2016 1901   LABSPEC 1.017 10/19/2014 1916   PHURINE 5.0 12/22/2016 1901   GLUCOSEU NEGATIVE 12/22/2016 1901   GLUCOSEU Negative 10/19/2014 1916   HGBUR NEGATIVE 12/22/2016 1901   BILIRUBINUR NEGATIVE 12/22/2016 1901   BILIRUBINUR Negative 08/16/2016 1337   BILIRUBINUR Negative 10/19/2014 1916   KETONESUR NEGATIVE 12/22/2016 1901   PROTEINUR 30 (A) 12/22/2016 1901   NITRITE NEGATIVE 12/22/2016 1901   LEUKOCYTESUR NEGATIVE 12/22/2016 1901   LEUKOCYTESUR 3+ (A) 08/16/2016 1337   LEUKOCYTESUR Trace 10/19/2014 1916    Pertinent Imaging: CT  Assessment & Plan:    Left UPJ stone- She has apnea, A. fib and frailty and is not a good candidate for a percutaneous approach or shockwave. We discussed simply removing the stent or stent changes but she'll probably best be served by ureteroscopy although we discussed she may need a staged procedure and there will be significant risks of infection, bleeding and other cardiovascular issues. Cathed U/A sent and then I start on a night prophylactic dose given she'll be at high risk for UTI with chronic bacteriuria, stent, stone, etc.   There are no diagnoses linked to this encounter.  No Follow-up on file.  Festus Aloe, [facility] [address], [place], [place] [address] [phone]

## 2017-01-30 NOTE — Anesthesia Post-op Follow-up Note (Cosign Needed)
Anesthesia QCDR form completed.        

## 2017-01-30 NOTE — Anesthesia Postprocedure Evaluation (Signed)
Anesthesia Post Note  Patient: Denise Matthews  Procedure(s) Performed: Procedure(s) (LRB): URETEROSCOPY WITH HOLMIUM LASER LITHOTRIPSY (Left) CYSTOSCOPY WITH STENT REPLACEMENT (Left)  Patient location during evaluation: PACU Anesthesia Type: General Level of consciousness: awake and alert and oriented Pain management: pain level controlled Vital Signs Assessment: post-procedure vital signs reviewed and stable Respiratory status: spontaneous breathing Cardiovascular status: blood pressure returned to baseline Anesthetic complications: no     Last Vitals:  Vitals:   01/30/17 1002 01/30/17 1023  BP: (!) 154/72 (!) 156/76  Pulse: 95   Resp: 16 16  Temp: 36.6 C     Last Pain:  Vitals:   01/30/17 1002  TempSrc: Tympanic                 Baraka Klatt

## 2017-01-30 NOTE — Progress Notes (Signed)
Blood pressure 161/88  Dr Kayleen Memos in to see pt  Pt okay to to to sds

## 2017-01-30 NOTE — Progress Notes (Signed)
Mumbling but not understandable

## 2017-01-30 NOTE — Discharge Instructions (Addendum)
You have a ureteral stent in place.  This is a tube that extends from your kidney to your bladder.  This may cause urinary bleeding, burning with urination, and urinary frequency.  Please call our office or present to the ED if you develop fevers >101 or pain which is not able to be controlled with oral pain medications.  You may be given either Flomax and/ or ditropan to help with bladder spasms and stent pain in addition to pain medications.    It is imperative that you follow up as scheduled for stent removal.    [facility] [address] [place], [place] [address] [phone]       AMBULATORY SURGERY  DISCHARGE INSTRUCTIONS   1) The drugs that you were given will stay in your system until tomorrow so for the next 24 hours you should not:  A) Drive an automobile B) Make any legal decisions C) Drink any alcoholic beverage   2) You may resume regular meals tomorrow.  Today it is better to start with liquids and gradually work up to solid foods.  You may eat anything you prefer, but it is better to start with liquids, then soup and crackers, and gradually work up to solid foods.   3) Please notify your doctor immediately if you have any unusual bleeding, trouble breathing, redness and pain at the surgery site, drainage, fever, or pain not relieved by medication.    4) Additional Instructions:        Please contact your physician with any problems or Same Day Surgery at [phone], Monday through Friday 6 am to 4 pm, or [place] at [facility] number at [phone].AMBULATORY SURGERY  DISCHARGE INSTRUCTIONS   5) The drugs that you were given will stay in your system until tomorrow so for the next 24 hours you should not:  D) Drive an automobile E) Make any legal decisions F) Drink any alcoholic beverage   6) You may resume regular meals tomorrow.  Today it is better to start with liquids and gradually work up to solid  foods.  You may eat anything you prefer, but it is better to start with liquids, then soup and crackers, and gradually work up to solid foods.   7) Please notify your doctor immediately if you have any unusual bleeding, trouble breathing, redness and pain at the surgery site, drainage, fever, or pain not relieved by medication.    8) Additional Instructions:        Please contact your physician with any problems or Same Day Surgery at [phone], Monday through Friday 6 am to 4 pm, or [place] at [facility] number at [phone].

## 2017-01-30 NOTE — Transfer of Care (Signed)
Immediate Anesthesia Transfer of Care Note  Patient: NEKAYLA SCHREIFELS  Procedure(s) Performed: Procedure(s): URETEROSCOPY WITH HOLMIUM LASER LITHOTRIPSY (Left) CYSTOSCOPY WITH STENT REPLACEMENT (Left)  Patient Location: PACU  Anesthesia Type:General  Level of Consciousness: awake and confused  Airway & Oxygen Therapy: Patient Spontanous Breathing and Patient connected to face mask oxygen  Post-op Assessment: Report given to RN  Post vital signs: Reviewed and stable  Last Vitals:  Vitals:   01/30/17 0616 01/30/17 0910  BP: (!) 161/63 (!) 173/89  Pulse: 86 84  Resp: 18 17  Temp: 36.7 C 36.8 C    Last Pain:  Vitals:   01/30/17 0616  TempSrc: Oral         Complications: No apparent anesthesia complications

## 2017-01-30 NOTE — Progress Notes (Signed)
Labetalol 5mg  given for blood pressure of 157/99

## 2017-01-30 NOTE — Progress Notes (Signed)
Pt. Here in [facility] from resource center, patient Has known bedsore to her bottom, has atleast 2-3 places of old skin tears to legs with bandages over areas.    Patient Has scabbed over area to her right elbow, no dressing Over area..  Daughter is full aware of her skin issues And skin tears.

## 2017-01-30 NOTE — Anesthesia Preprocedure Evaluation (Signed)
Anesthesia Evaluation  Patient identified by MRN, date of birth, ID band Patient awake    Reviewed: Allergy & Precautions, H&P , NPO status , Patient's Chart, lab work & pertinent test results, reviewed documented beta blocker date and time   History of Anesthesia Complications Negative for: history of anesthetic complications  Airway Mallampati: III  TM Distance: >3 FB Neck ROM: full    Dental  (+) Poor Dentition, Missing   Pulmonary neg shortness of breath, sleep apnea , pneumonia, neg COPD, neg recent URI,           Cardiovascular Exercise Tolerance: Good hypertension, Pt. on medications and Pt. on home beta blockers (-) angina(-) CAD, (-) Past MI, (-) Cardiac Stents and (-) CABG (-) dysrhythmias (-) Valvular Problems/Murmurs     Neuro/Psych PSYCHIATRIC DISORDERS (Dementia) dementia    GI/Hepatic Neg liver ROS, GERD  ,  Endo/Other  negative endocrine ROS  Renal/GU Renal disease  negative genitourinary   Musculoskeletal  (+) Arthritis , Osteoarthritis,    Abdominal   Peds negative pediatric ROS (+)  Hematology negative hematology ROS (+) anemia ,   Anesthesia Other Findings Past Medical History: No date: Arthritis No date: Atrial fibrillation (H[facility]) No date: Dementia No date: Heartburn No date: HTN (hypertension) No date: Recurrent UTI No date: Sleep apnea No date: Vaginal atrophy   Reproductive/Obstetrics negative OB ROS                             Anesthesia Physical  Anesthesia Plan  ASA: III  Anesthesia Plan: General ETT   Post-op Pain Management:    Induction: Intravenous  Airway Management Planned: Oral ETT  Additional Equipment:   Intra-op Plan:   Post-operative Plan: Extubation in OR  Informed Consent: I have reviewed the patients History and Physical, chart, labs and discussed the procedure including the risks, benefits and alternatives for the proposed  anesthesia with the patient or authorized representative who has indicated his/her understanding and acceptance.   Dental Advisory Given  Plan Discussed with: Anesthesiologist, CRNA and Surgeon  Anesthesia Plan Comments:         Anesthesia Quick Evaluation

## 2017-01-30 NOTE — OR Nursing (Signed)
Discharge instructions given to Denise Matthews @ [facility] via tele [phone]  Pt for pick up by transportation/Mike/[facility] [phone] (advises he will be here approx [phone].  Notified pt's daughter Otila Kluver via tele [phone] re above

## 2017-01-30 NOTE — Op Note (Signed)
Date of procedure: 01/30/17  Preoperative diagnosis:  1. Left UPJ Matthews 2. History of sepsis related to urinary source   Postoperative diagnosis:  1. Same as above   Procedure: 1. Left ureteroscopy 2. Laser lithotripsy 3. Basket extraction of Matthews fragment 4. Left retrograde pyelogram 5. Left ureteral stent exchange  Surgeon: Hollice Espy, MD  Anesthesia: General  Complications: None  Intraoperative findings: 10 mm Matthews at the UPJ. A few small additional midpole nonobstructing calculi treated.   EBL: Minimal  Specimens: None  Drains: 6 x 24 French double-J ureteral stent on left  Indication: Denise Matthews is a 83 y.o. patient with who initially presented with sepsis related to urinary source. She was eventually found to have a 10 mm obstructing left UPJ Matthews and underwent emergent ureteral stent placement. She returns today for definitive management of her Matthews.  After reviewing the management options for treatment, she/ her daughter (health care proxy) elected to proceed with the above surgical procedure(s). We have discussed the potential benefits and risks of the procedure, side effects of the proposed treatment, the likelihood of the patient achieving the goals of the procedure, and any potential problems that might occur during the procedure or recuperation. Informed consent has been obtained.  Description of procedure:  The patient was taken to the operating room and general anesthesia was induced.  The patient was placed in the dorsal lithotomy position, prepped and draped in the usual sterile fashion, and preoperative antibiotics were administered. A preoperative time-out was performed.   A 21 French cystoscope was advanced per urethra into the bladder. Upon entering the bladder, cloudy debris-filled urine was appreciated. The bladder was then drained. Attention was then turned to the left ureteral orifice from which a ureteral stent was seen emanating. The distal  coil of the stent was grasped and brought to level of the urethral meatus. This is then cannulated up to level of the kidney where the Matthews could be easily seen at the UPJ. The wire was then left in place as a safety wire and the stent was removed. I first attempted to use a 4.5 semirigid ureteroscope alongside of the safety wire but was unable to advance the scope beyond the mid ureter. Next, a second Super Stiff wire was introduced up to level of the kidney beyond the Matthews. A Cook 12/14 access sheath was then advanced over the Super Stiff wire up to level of the proximal ureter. This went quite easily and the inner cannula was removed. A dual lumen 8 [place] Wolf ureteroscope was then brought in and able to be advanced up to level of the kidney without difficulty. The Matthews was encountered within the renal pelvis. A 273  laser fiber was then brought in and using the settings 0.2 J and 40 Hz, the Matthews was dusted into very fine crystals no larger than the tip of the laser fiber. Each never calyx was then directly visualized and no residual Matthews fragments identified. There was a clot within the UPJ which was removed using a basket to ensure that there was no underlying Matthews fragments. Finally, the scope was backed to level of the proximal ureter and I retropyelogram was performed to create a roadmap of the kidney. Each and every calyx was then directly visualized again and no additional Matthews fragment was identified. The scope was then backed down the length of the ureter removing the access sheath along the way and expecting the ureter for any evidence of residual fragments or injuries  which were not appreciated. A 6 x 24 French double-J ureteral stent was then advanced over the wire up to level of the renal pelvis. The wire was partially drawn until full coil was noted within the renal pelvis. The wire was then fully withdrawn until full coil was noted within the bladder. The bladder was drained using the  cystoscope sheath. The patient was cleaned and dried, repositioned the supine position, reversed from anesthesia, taken to the PACU in stable condition.  Plan: There are no complications of this case. Anesthesia was well tolerated. Case was discussed with the patient's daughter by telephone. She will return next week for cystoscopy, stent removal.  Hollice Espy, M.D.

## 2017-01-30 NOTE — Anesthesia Procedure Notes (Signed)
Procedure Name: Intubation Date/Time: 01/30/2017 7:58 AM Performed by: [id number], Levar Fayson Pre-anesthesia Checklist: Patient identified, Emergency Drugs available, Suction available, Patient being monitored and Timeout performed Patient Re-evaluated:Patient Re-evaluated prior to inductionOxygen Delivery Method: Circle system utilized Preoxygenation: Pre-oxygenation with 100% oxygen Intubation Type: IV induction Laryngoscope Size: Mac and 3 Grade View: Grade II Tube type: Oral Number of attempts: 1 Airway Equipment and Method: Stylet Placement Confirmation: ETT inserted through vocal cords under direct vision,  positive ETCO2,  CO2 detector and breath sounds checked- equal and bilateral Secured at: 22 cm Tube secured with: Tape

## 2017-01-31 DIAGNOSIS — M25562 Pain in left knee: Secondary | ICD-10-CM | POA: Diagnosis not present

## 2017-01-31 DIAGNOSIS — D519 Vitamin B12 deficiency anemia, unspecified: Secondary | ICD-10-CM | POA: Diagnosis not present

## 2017-01-31 DIAGNOSIS — I10 Essential (primary) hypertension: Secondary | ICD-10-CM | POA: Diagnosis not present

## 2017-01-31 DIAGNOSIS — N390 Urinary tract infection, site not specified: Secondary | ICD-10-CM | POA: Diagnosis not present

## 2017-01-31 DIAGNOSIS — M25561 Pain in right knee: Secondary | ICD-10-CM | POA: Diagnosis not present

## 2017-01-31 DIAGNOSIS — F0390 Unspecified dementia without behavioral disturbance: Secondary | ICD-10-CM | POA: Diagnosis not present

## 2017-01-31 DIAGNOSIS — N200 Calculus of kidney: Secondary | ICD-10-CM | POA: Diagnosis not present

## 2017-02-09 ENCOUNTER — Other Ambulatory Visit: Payer: Self-pay | Admitting: *Deleted

## 2017-02-09 ENCOUNTER — Other Ambulatory Visit: Payer: PPO | Admitting: Urology

## 2017-02-09 ENCOUNTER — Encounter: Payer: Self-pay | Admitting: Urology

## 2017-02-09 DIAGNOSIS — A419 Sepsis, unspecified organism: Secondary | ICD-10-CM | POA: Diagnosis not present

## 2017-02-09 DIAGNOSIS — F0390 Unspecified dementia without behavioral disturbance: Secondary | ICD-10-CM | POA: Diagnosis not present

## 2017-02-09 DIAGNOSIS — L89159 Pressure ulcer of sacral region, unspecified stage: Secondary | ICD-10-CM | POA: Diagnosis not present

## 2017-02-09 DIAGNOSIS — D519 Vitamin B12 deficiency anemia, unspecified: Secondary | ICD-10-CM | POA: Diagnosis not present

## 2017-02-09 DIAGNOSIS — M25562 Pain in left knee: Secondary | ICD-10-CM | POA: Diagnosis not present

## 2017-02-09 DIAGNOSIS — M25561 Pain in right knee: Secondary | ICD-10-CM | POA: Diagnosis not present

## 2017-02-09 DIAGNOSIS — I10 Essential (primary) hypertension: Secondary | ICD-10-CM | POA: Diagnosis not present

## 2017-02-09 DIAGNOSIS — M6281 Muscle weakness (generalized): Secondary | ICD-10-CM | POA: Diagnosis not present

## 2017-02-09 NOTE — Patient Outreach (Signed)
[facility]) Care Management  02/09/2017  Denise Matthews Jun 25, 1920 [id number]   Met with patient daughter at patient bedside. Patient lying in bed sleeping.  Daughter states she has seen a decline in her mother since getting back from her trip to [place]. We discuss the implications of patient going home and what needs patient will have and also things like equipment and caregiver respite. Patient has appointment tomorrow with urology to have stents removed, daughter would like to take patient home soon after this appointment. She is in a second level appeal with Medicare regarding SNF stay payment.  Patient daughter states she is hoping to get some paid help in the home. We discuss home care providers, number given to daughter. Patient states she is unsure how she will maneuver patient up the steps into home due to inability to stand and ambulate. We discuss ramps, RNCM gave amramp number to check on ramp rental for home.  Daughter gets teary after discussing patient decline and needs, she states she is considering Hospice care, unsure how to go about getting Hospice, patient has been under hospice care in the past and patient father had Hospice before he passed away and they have had a good experience with Hospice, they used [facility].    RNCM spoke with Donnalee Curry, SW at facility, discussed daughter concerns and wishes to check on going home with Hospice services. Janett Billow will go and speak with daughter.   Plan to monitor for [facility] care management needs, if patient goes home with Hospice will close referral and case. If patient discharges home with home care, [facility] will follow for transition of care.  Royetta Crochet. Laymond Purser, RN, BSN, [place] [phone]) Business Cell  [phone]) Toll Free Office

## 2017-02-10 ENCOUNTER — Encounter: Payer: Self-pay | Admitting: Urology

## 2017-02-10 ENCOUNTER — Ambulatory Visit: Payer: PPO | Admitting: Urology

## 2017-02-10 ENCOUNTER — Other Ambulatory Visit: Payer: Self-pay | Admitting: *Deleted

## 2017-02-10 VITALS — BP 124/77 | HR 87 | Ht 63.5 in | Wt 115.0 lb

## 2017-02-10 DIAGNOSIS — N200 Calculus of kidney: Secondary | ICD-10-CM

## 2017-02-10 DIAGNOSIS — N390 Urinary tract infection, site not specified: Secondary | ICD-10-CM

## 2017-02-10 MED ORDER — SULFAMETHOXAZOLE-TRIMETHOPRIM 800-160 MG PO TABS: 1.0000 | ORAL_TABLET | Freq: Two times a day (BID) | ORAL | 0 refills | Status: AC

## 2017-02-10 MED ORDER — FLUCONAZOLE 100 MG PO TABS: 100.0000 mg | ORAL_TABLET | Freq: Every day | ORAL | 0 refills | Status: AC

## 2017-02-10 MED ORDER — CEFTRIAXONE SODIUM 1 G IJ SOLR
1.0000 g | Freq: Once | INTRAMUSCULAR | Status: AC
  Administered 2017-02-10: 1 g via INTRAMUSCULAR

## 2017-03-12 NOTE — Patient Outreach (Signed)
[facility]) Care Management  2017-03-05  WELCOME SIRAK [birth year] [id number]   Follow up call to Donnalee Curry, SW at [facility] facility re: patient status and Hospice services.  She reports that Hospices services have been arranged for patient upon her discharge home tomorrow.  They have already rec'd orders from Primary care MD and plan to deliver needed equipment home today and begin services tomorrow upon discharge from facility.   Plan to let assigned [facility] RNCM know of patient transition to Hospice services and plan to close referral as no [facility] care management needs noted at this time.   Royetta Crochet. Laymond Purser, RN, BSN, [place] [phone]) Business Cell  [phone]) Toll Free Office

## 2017-03-12 NOTE — Progress Notes (Signed)
   March 07, 2017  CC: left ureteral stent  HPI: 83 year old with an obstructing left ureteral stone, recent admission for urosepsis who underwent definitive management of her stone via ureteroscopy on 01/30/17. She returns today for cystoscopy, stent removal. Over the past few months, she is continued to clinically declined. She is currently at [facility] but is being discharged tomorrow to home with hospice.  Blood pressure 124/77, pulse 87, height 5' 3.5" (1.613 m), weight 115 lb (52.2 kg). NED. A&Ox3.   No respiratory distress   Abd soft, NT, ND Normal external genitalia with patent urethral meatus  Cystoscopy/ Stent removal procedure  Patient identification was confirmed, informed consent was obtained, and patient was prepped using Betadine solution.  Lidocaine jelly was administered per urethral meatus.    Preoperative abx where received prior to procedure.    Procedure: - Flexible cystoscope introduced, without any difficulty.   - Thorough search of the bladder revealed:    normal urethral meatus  Stent seen emanating from left ureteral orifice, grasped with stent graspers, and removed in entirety.     Moderate debris in bladder limiting visualization.  Post-Procedure: - Patient tolerated the procedure well   Assessment/ Plan:  1. Nephrolithiasis Given that she will be under the care of hospice tomorrow, I recommended palliative removal of her left ureteral stent for comfort measures to which her daughter is agreeable despite likely contaminated urine today Left ureteral stent removed today following left ureteroscopy for definitive management of her obstructing ureteral stone Agreed to defer any further renal imaging given overall prognosis - cefTRIAXone (ROCEPHIN) injection 1 g; Inject 1 g into the muscle once.  2. Recurrent UTI Urine today foul-smelling and cloudy She was given ceftriaxone in the office today and sent home on Bactrim/ flucazole x 1 week in setting of  manipulation    Hollice Espy, MD

## 2017-03-12 DEATH — deceased

## 2017-04-11 DEATH — deceased

## 2017-08-16 ENCOUNTER — Ambulatory Visit: Payer: PPO | Admitting: Urology

## 2017-11-29 IMAGING — CT CT HEAD W/O CM
3 series · 15 of 47 positions shown, 18 images · non-contrast
Comparison: 10/19/2014

CLINICAL DATA: Altered mental status

EXAM:
CT HEAD WITHOUT CONTRAST
TECHNIQUE: Contiguous axial images were obtained from the base of the skull
through the vertex without intravenous contrast.

[Series 2: head wo · axial · 0.46mm/px · z∈[-148,-13]mm · 9 of 33 slices shown, 12 images]
[im 3/33  brain]
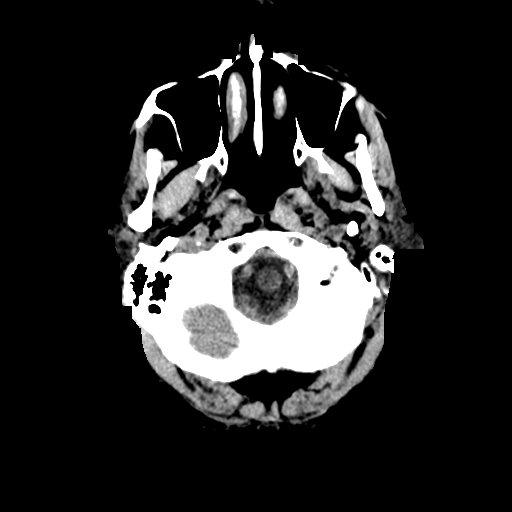
[im 3/33  bone]
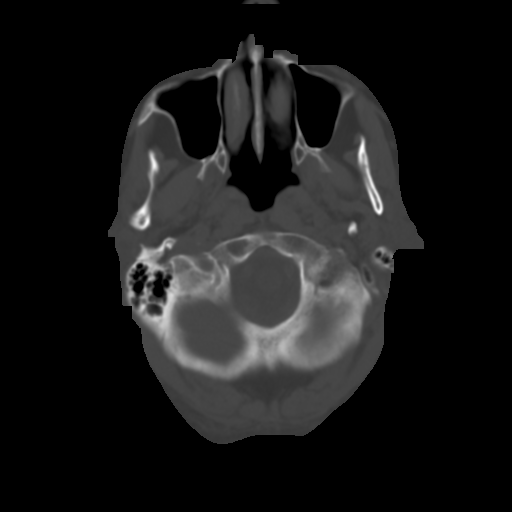
[im 6/33  brain]
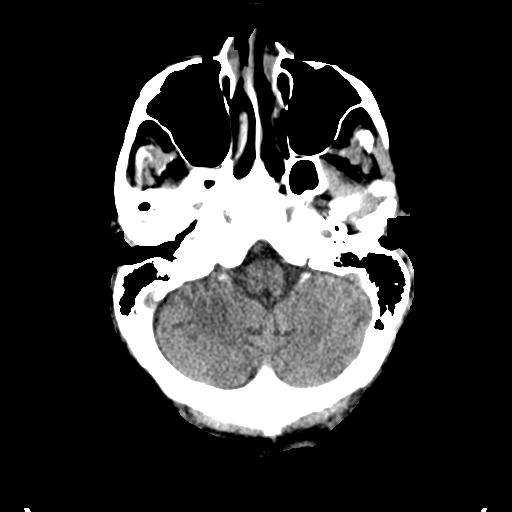
[im 9/33  brain]
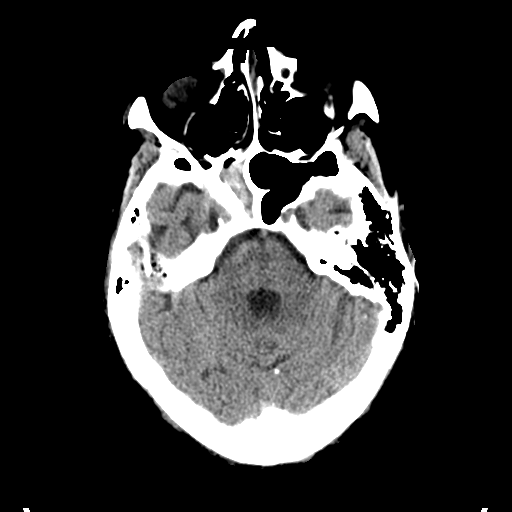
[im 13/33  brain]
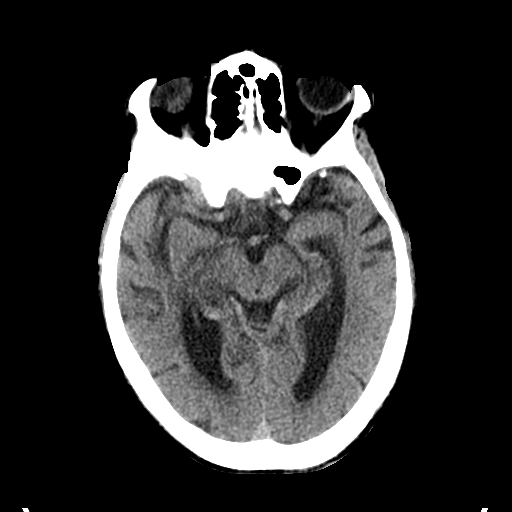
[im 17/33  brain]
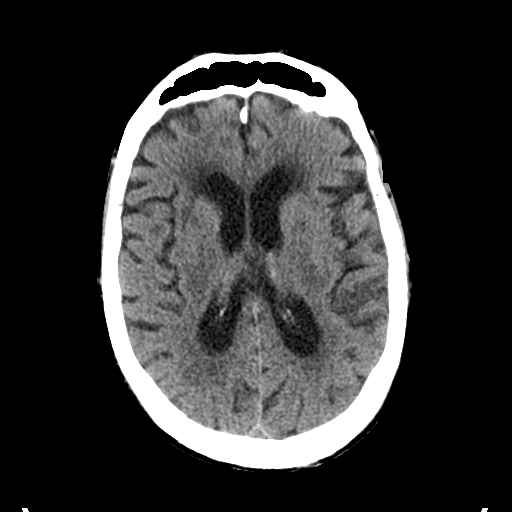
[im 17/33  bone]
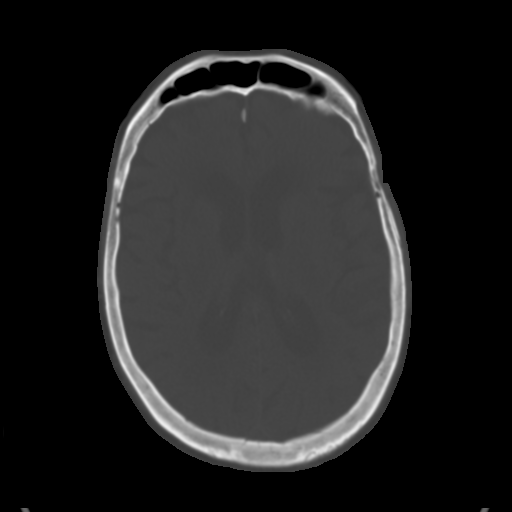
[im 20/33  brain]
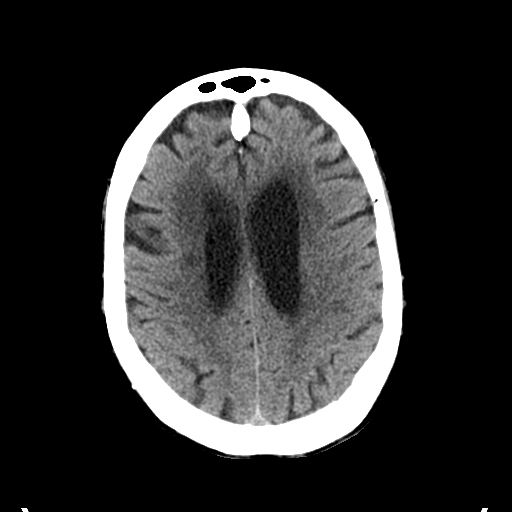
[im 24/33  brain]
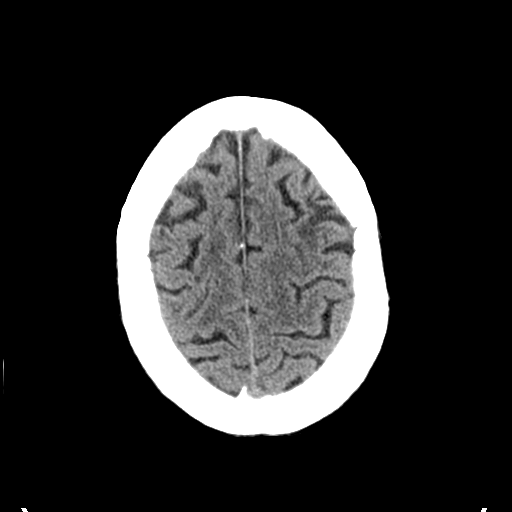
[im 27/33  brain]
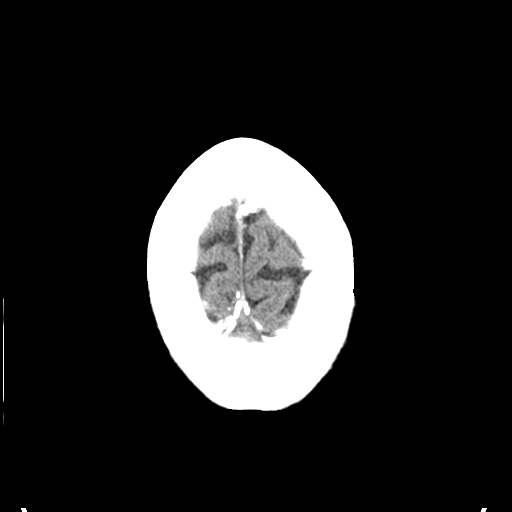
[im 30/33  brain]
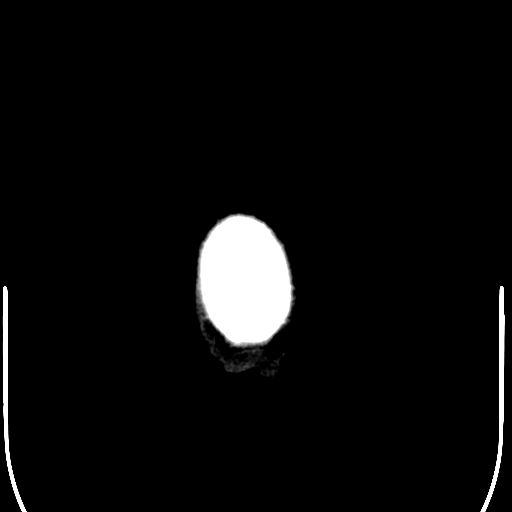
[im 30/33  bone]
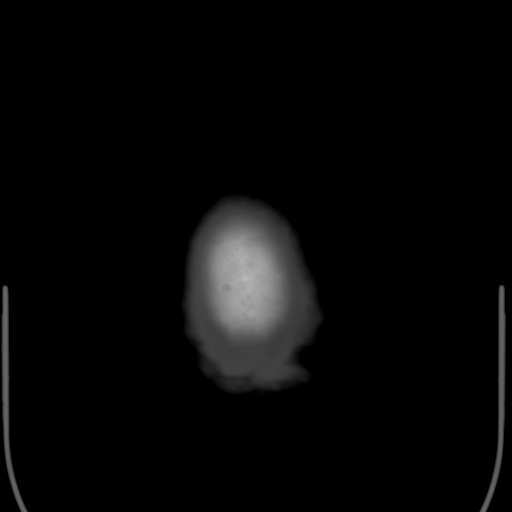

[Series 4: coronal soft tissue · coronal · 0.35mm/px · 3 of 67 slices shown]
[im 23/67  brain]
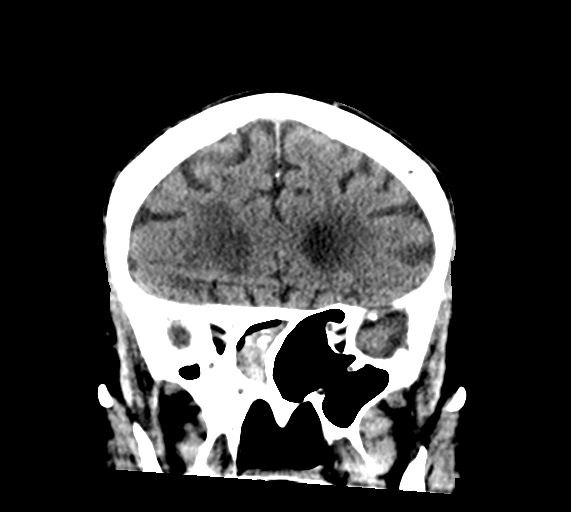
[im 30/67  brain]
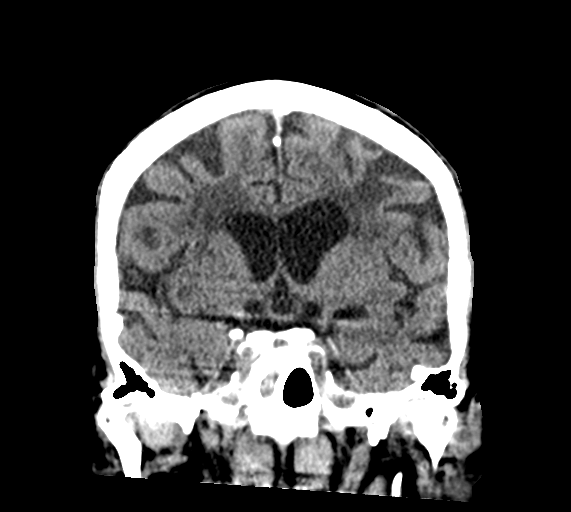
[im 37/67  brain]
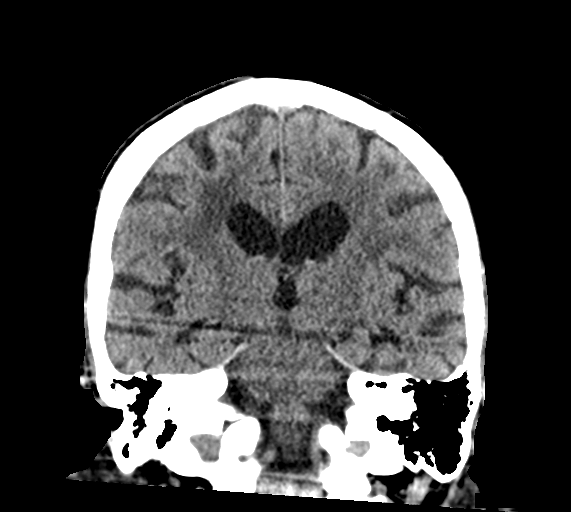

[Series 5: sagittal soft tissue · sagittal · 0.33mm/px · 3 of 50 slices shown]
[im 17/50  brain]
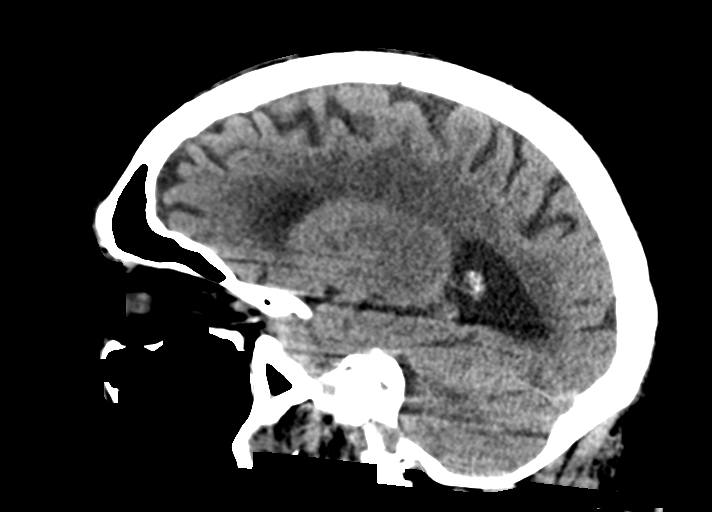
[im 25/50  brain]
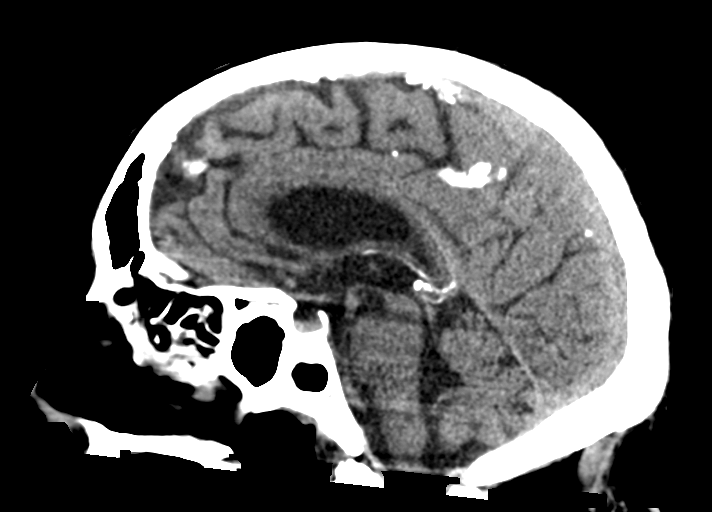
[im 33/50  brain]
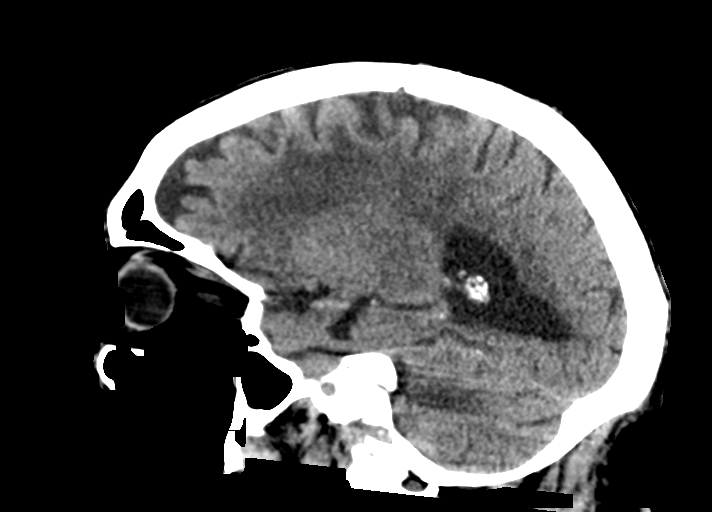

[15 of 47 positions shown; findings below may reference images not displayed]

FINDINGS: Brain: No acute territorial infarction or intracranial hemorrhage is
visualized. No focal mass, mass effect or midline shift. Moderate
periventricular and subcortical white matter hypodensity consistent
with small vessel disease. Hypodensity within the left cerebellum
also unchanged. Ventricles are stable in size. Moderate atrophy.

Vascular: No hyperdense vessels.  Carotid artery calcifications.

Skull: No fracture.  Mastoid air cells clear.

Sinuses/Orbits: Opacifications of the right sphenoid sinus with
calcific deposits present. Mild thickening within the ethmoid
sinuses. No acute orbital abnormality.

Other: None
IMPRESSION: 1. No definite CT evidence for acute intracranial abnormality
2. Moderate periventricular and subcortical white matter small
vessel disease

## 2017-11-29 IMAGING — DX DG CHEST 1V PORT
1 series · 1 of 1 positions shown · non-contrast
Comparison: Chest radiograph performed 10/19/2014

CLINICAL DATA: Acute onset of fever and lethargy. Initial
encounter.

EXAM:
PORTABLE CHEST 1 VIEW

[chest ap]
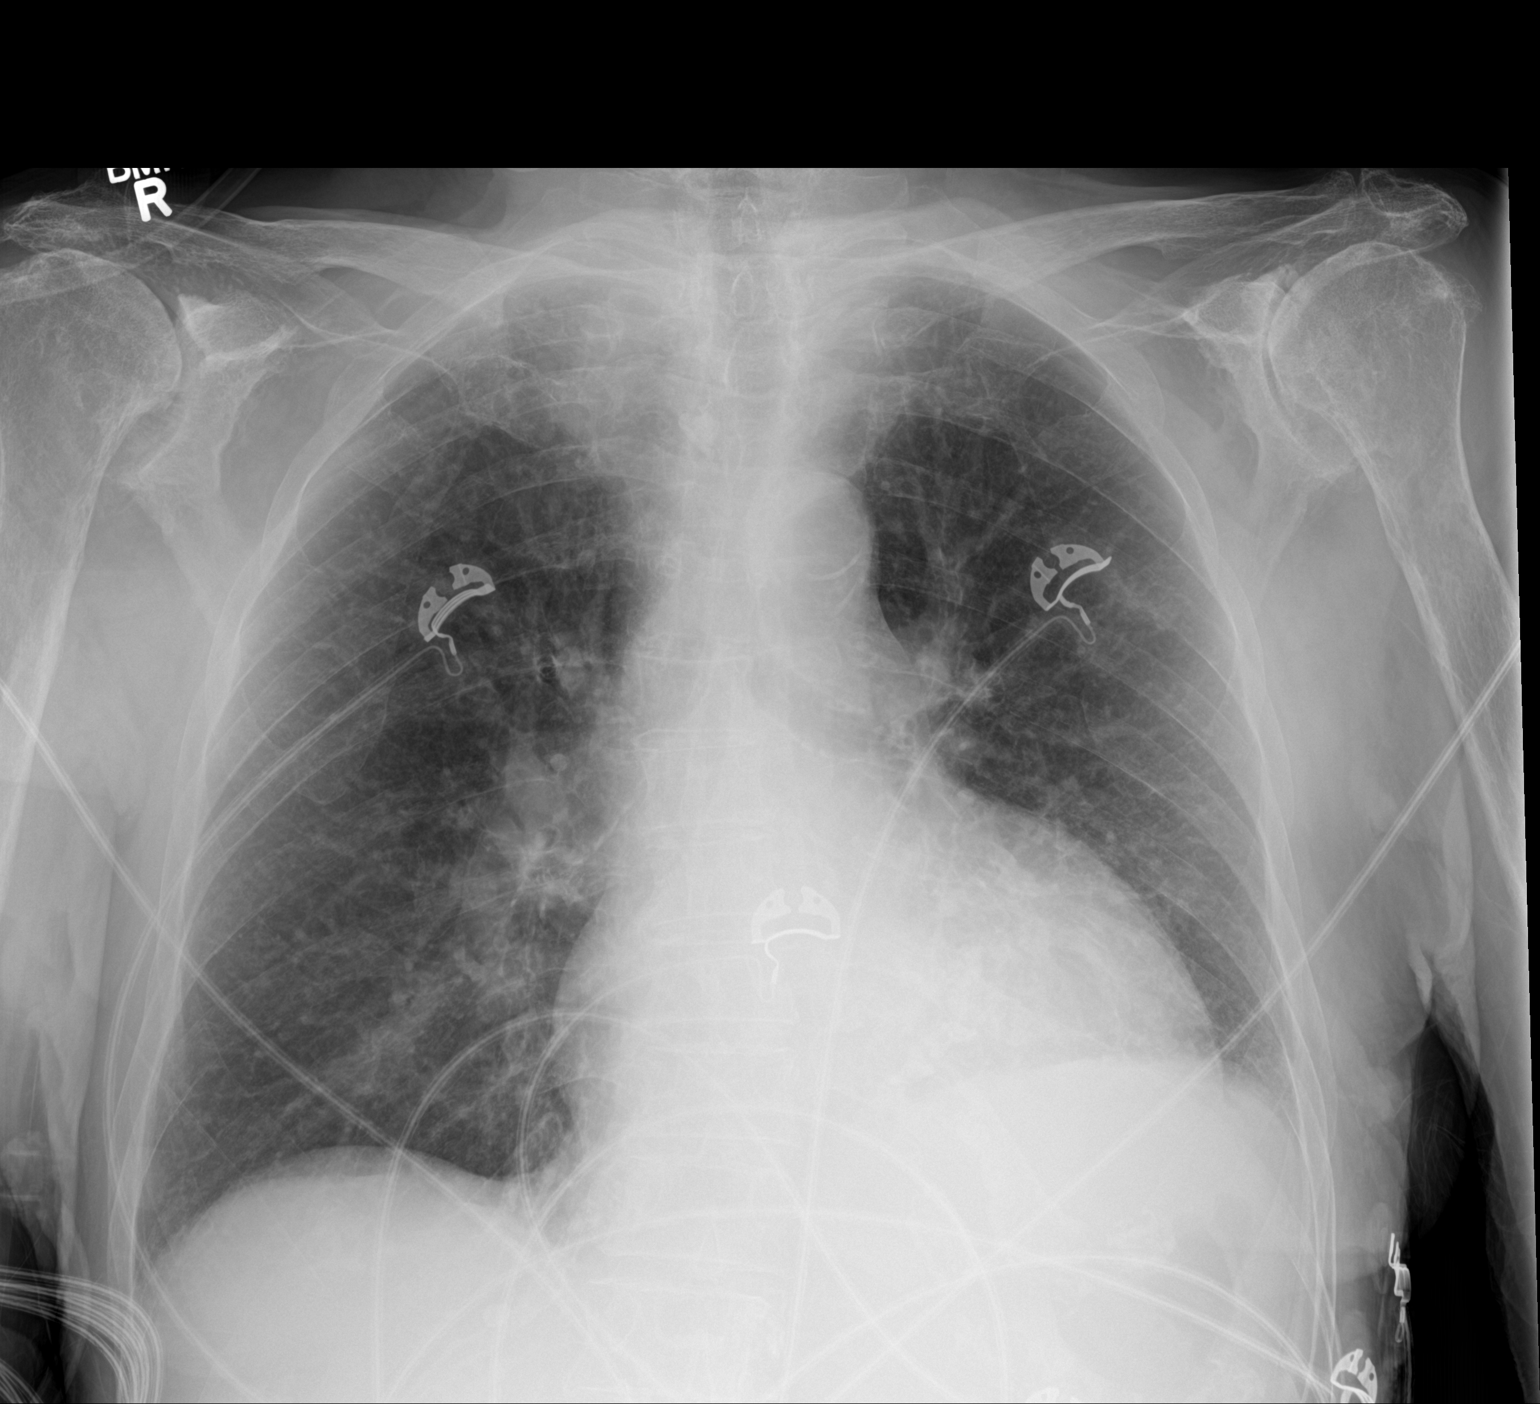

[1 of 1 positions shown; findings below may reference images not displayed]

FINDINGS: The lungs are well-aerated. Mild vascular congestion is noted. There
is mild elevation of the left hemidiaphragm. Mild right basilar
airspace opacity may reflect pneumonia. There is no evidence of
pleural effusion or pneumothorax.

The cardiomediastinal silhouette is borderline enlarged. No acute
osseous abnormalities are seen. Degenerative change is noted at the
glenohumeral joints bilaterally.
IMPRESSION: Mild vascular congestion and borderline cardiomegaly. Mild elevation
of the left hemidiaphragm. Mild right basilar airspace opacity may
reflect pneumonia.
